# Patient Record
Sex: Male | Born: 1990 | Race: White | Hispanic: No | Marital: Single | Smoking: Current some day smoker
Health system: Southern US, Community
[De-identification: ages and names within clinical notes are randomized; demographics above are authoritative.]

## PROBLEM LIST (undated history)

## (undated) DIAGNOSIS — I10 Essential (primary) hypertension: Secondary | ICD-10-CM

---

## 2020-05-27 ENCOUNTER — Emergency Department (HOSPITAL_BASED_OUTPATIENT_CLINIC_OR_DEPARTMENT_OTHER): Payer: Managed Care, Other (non HMO)

## 2020-05-27 ENCOUNTER — Encounter (HOSPITAL_BASED_OUTPATIENT_CLINIC_OR_DEPARTMENT_OTHER): Payer: Self-pay

## 2020-05-27 ENCOUNTER — Other Ambulatory Visit: Payer: Self-pay

## 2020-05-27 ENCOUNTER — Observation Stay (HOSPITAL_BASED_OUTPATIENT_CLINIC_OR_DEPARTMENT_OTHER)
Admission: EM | Admit: 2020-05-27 | Discharge: 2020-05-30 | Disposition: A | Discharge status: Home / Self Care | Payer: Managed Care, Other (non HMO) | Attending: Internal Medicine | Admitting: Internal Medicine

## 2020-05-27 DIAGNOSIS — K7200 Acute and subacute hepatic failure without coma: Principal | ICD-10-CM | POA: Diagnosis present

## 2020-05-27 DIAGNOSIS — R1084 Generalized abdominal pain: Secondary | ICD-10-CM | POA: Insufficient documentation

## 2020-05-27 DIAGNOSIS — R17 Unspecified jaundice: Secondary | ICD-10-CM

## 2020-05-27 DIAGNOSIS — Z20822 Contact with and (suspected) exposure to covid-19: Secondary | ICD-10-CM | POA: Insufficient documentation

## 2020-05-27 DIAGNOSIS — R7989 Other specified abnormal findings of blood chemistry: Secondary | ICD-10-CM

## 2020-05-27 DIAGNOSIS — R7401 Elevation of levels of liver transaminase levels: Secondary | ICD-10-CM

## 2020-05-27 DIAGNOSIS — R5383 Other fatigue: Secondary | ICD-10-CM | POA: Diagnosis present

## 2020-05-27 DIAGNOSIS — R933 Abnormal findings on diagnostic imaging of other parts of digestive tract: Secondary | ICD-10-CM

## 2020-05-27 DIAGNOSIS — F17290 Nicotine dependence, other tobacco product, uncomplicated: Secondary | ICD-10-CM | POA: Diagnosis not present

## 2020-05-27 DIAGNOSIS — Y900 Blood alcohol level of less than 20 mg/100 ml: Secondary | ICD-10-CM | POA: Diagnosis not present

## 2020-05-27 DIAGNOSIS — I10 Essential (primary) hypertension: Secondary | ICD-10-CM | POA: Diagnosis not present

## 2020-05-27 HISTORY — DX: Essential (primary) hypertension: I10

## 2020-05-27 LAB — URINALYSIS, ROUTINE W REFLEX MICROSCOPIC
Glucose, UA: NEGATIVE mg/dL
Hgb urine dipstick: NEGATIVE
Ketones, ur: NEGATIVE mg/dL
Leukocytes,Ua: NEGATIVE
Nitrite: NEGATIVE
Protein, ur: NEGATIVE mg/dL
Specific Gravity, Urine: 1.02 (ref 1.005–1.030)
pH: 5.5 (ref 5.0–8.0)

## 2020-05-27 LAB — ACETAMINOPHEN LEVEL: Acetaminophen (Tylenol), Serum: <10 ug/mL — ABNORMAL LOW (ref 10–30)

## 2020-05-27 LAB — RAPID URINE DRUG SCREEN, HOSP PERFORMED
Amphetamines: NOT DETECTED
Barbiturates: NOT DETECTED
Benzodiazepines: NOT DETECTED
Cocaine: NOT DETECTED
Opiates: NOT DETECTED
Tetrahydrocannabinol: NOT DETECTED

## 2020-05-27 LAB — HEPATIC FUNCTION PANEL
ALT: 5698 U/L — ABNORMAL HIGH (ref 0–44)
AST: 1929 U/L — ABNORMAL HIGH (ref 15–41)
Albumin: 4.4 g/dL (ref 3.5–5.0)
Alkaline Phosphatase: 171 U/L — ABNORMAL HIGH (ref 38–126)
Bilirubin, Direct: 5.8 mg/dL — ABNORMAL HIGH (ref 0.0–0.2)
Indirect Bilirubin: 4.5 mg/dL — ABNORMAL HIGH (ref 0.3–0.9)
Total Bilirubin: 10.3 mg/dL — ABNORMAL HIGH (ref 0.3–1.2)
Total Protein: 7 g/dL (ref 6.5–8.1)

## 2020-05-27 LAB — CBC
HCT: 55.7 % — ABNORMAL HIGH (ref 39.0–52.0)
Hemoglobin: 18.6 g/dL — ABNORMAL HIGH (ref 13.0–17.0)
MCH: 31.9 pg (ref 26.0–34.0)
MCHC: 33.4 g/dL (ref 30.0–36.0)
MCV: 95.5 fL (ref 80.0–100.0)
Platelets: 265 10*3/uL (ref 150–400)
RBC: 5.83 MIL/uL — ABNORMAL HIGH (ref 4.22–5.81)
RDW: 12.8 % (ref 11.5–15.5)
WBC: 7.5 10*3/uL (ref 4.0–10.5)
nRBC: 0 % (ref 0.0–0.2)

## 2020-05-27 LAB — RESP PANEL BY RT-PCR (FLU A&B, COVID) ARPGX2
Influenza A by PCR: NEGATIVE
Influenza B by PCR: NEGATIVE
SARS Coronavirus 2 by RT PCR: NEGATIVE

## 2020-05-27 LAB — TROPONIN I (HIGH SENSITIVITY)
Troponin I (High Sensitivity): 12 ng/L (ref <18)
Troponin I (High Sensitivity): 16 ng/L (ref <18)

## 2020-05-27 LAB — ETHANOL: Alcohol, Ethyl (B): <10 mg/dL (ref <10)

## 2020-05-27 LAB — BASIC METABOLIC PANEL
Anion gap: 15 (ref 5–15)
BUN: 14 mg/dL (ref 6–20)
CO2: 21 mmol/L — ABNORMAL LOW (ref 22–32)
Calcium: 9.9 mg/dL (ref 8.9–10.3)
Chloride: 99 mmol/L (ref 98–111)
Creatinine, Ser: 0.69 mg/dL (ref 0.61–1.24)
GFR, Estimated: >60 mL/min (ref >60)
Glucose, Bld: 86 mg/dL (ref 70–99)
Potassium: 3.9 mmol/L (ref 3.5–5.1)
Sodium: 135 mmol/L (ref 135–145)

## 2020-05-27 LAB — APTT: aPTT: 33 seconds (ref 24–36)

## 2020-05-27 LAB — LIPASE, BLOOD: Lipase: 62 U/L — ABNORMAL HIGH (ref 11–51)

## 2020-05-27 LAB — PROTIME-INR
INR: 2.6 — ABNORMAL HIGH (ref 0.8–1.2)
Prothrombin Time: 27.5 seconds — ABNORMAL HIGH (ref 11.4–15.2)

## 2020-05-27 MED ORDER — SODIUM CHLORIDE 0.9 % IV SOLN: 250.0000 mL | INTRAVENOUS | Status: DC | PRN

## 2020-05-27 MED ORDER — SODIUM CHLORIDE 0.9% FLUSH: 3.0000 mL | INTRAVENOUS | Status: DC | PRN

## 2020-05-27 MED ORDER — SODIUM CHLORIDE 0.9% FLUSH
3.0000 mL | Freq: Two times a day (BID) | INTRAVENOUS | Status: DC
  Administered 2020-05-27 – 2020-05-29 (×4): 3 mL via INTRAVENOUS

## 2020-05-27 MED ORDER — SODIUM CHLORIDE 0.9 % IV BOLUS
1000.0000 mL | Freq: Once | INTRAVENOUS | Status: AC
  Administered 2020-05-27: 1000 mL via INTRAVENOUS

## 2020-05-27 MED ORDER — POLYETHYLENE GLYCOL 3350 17 G PO PACK: 17.0000 g | PACK | Freq: Every day | ORAL | Status: DC | PRN

## 2020-05-27 MED ORDER — HYDRALAZINE HCL 25 MG PO TABS: 25.0000 mg | ORAL_TABLET | Freq: Four times a day (QID) | ORAL | Status: DC | PRN

## 2020-05-27 MED ORDER — ONDANSETRON HCL 4 MG PO TABS: 4.0000 mg | ORAL_TABLET | Freq: Four times a day (QID) | ORAL | Status: DC | PRN

## 2020-05-27 MED ORDER — ONDANSETRON HCL 4 MG/2ML IJ SOLN
4.0000 mg | Freq: Once | INTRAMUSCULAR | Status: AC
  Administered 2020-05-27: 4 mg via INTRAVENOUS
  Filled 2020-05-27: qty 2

## 2020-05-27 MED ORDER — ONDANSETRON HCL 4 MG/2ML IJ SOLN
4.0000 mg | Freq: Four times a day (QID) | INTRAMUSCULAR | Status: DC | PRN
Start: 2020-05-27 — End: 2020-05-30

## 2020-05-27 MED ORDER — TRAMADOL HCL 50 MG PO TABS
50.0000 mg | ORAL_TABLET | Freq: Three times a day (TID) | ORAL | Status: DC | PRN
  Filled 2020-05-27: qty 1

## 2020-05-27 NOTE — Consult Note (Signed)
Referring Provider: Dr. Caleb Popp Primary Care Physician:  Pcp, No Primary Gastroenterologist:  Gentry Fitz  Reason for Consultation:  Jaundice  HPI: Needham Biggins is a 30 y.o. male with acute onset of nausea and vomiting, body aches, and severe fatigue for the past 4 days. Started having diffuse abdominal pain yesterday that was worse down his LEFT side. Noticed the yellow in his eyes on the way to the ER today. Denies change in BMs. Denies diarrhea or rectal bleeding. Denies previous history of jaundice or hepatitis. Drinks 3 mixed drinks per day about 4 times per week and last had a drink last Wed. Took Tylenol prn Thursday but denies going above the recommended dose. Has eaten out multiple times over the last week stating he is in town for work. Reports being in a monogamous relationship for the past 5 years. U/S negative for gallstones or biliary dilation. LFTs: TB 10.3, DB 5.8, AST 1,929, ALT 5,698, ALP 171, Lipase 62, INR 2.6, Plts 265.  Past Medical History:  Diagnosis Date  . Hypertension     History reviewed. No pertinent surgical history.  Prior to Admission medications   Not on File    Scheduled Meds: . sodium chloride flush  3 mL Intravenous Q12H   Continuous Infusions: . sodium chloride     PRN Meds:.sodium chloride, hydrALAZINE, ondansetron **OR** ondansetron (ZOFRAN) IV, polyethylene glycol, sodium chloride flush, traMADol  Allergies as of 05/27/2020  . (No Known Allergies)    Family History  Problem Relation Age of Onset  . Hypertension Father   . Cancer Father     Social History   Socioeconomic History  . Marital status: Single    Spouse name: Not on file  . Number of children: Not on file  . Years of education: Not on file  . Highest education level: Not on file  Occupational History  . Not on file  Tobacco Use  . Smoking status: Current Some Day Smoker    Types: Cigars  . Smokeless tobacco: Never Used  . Tobacco comment: Used to smoke cigarettes   Vaping Use  . Vaping Use: Former  Substance and Sexual Activity  . Alcohol use: Yes    Alcohol/week: 10.0 standard drinks    Types: 10 Shots of liquor per week  . Drug use: Not Currently  . Sexual activity: Not on file  Other Topics Concern  . Not on file  Social History Narrative  . Not on file   Social Determinants of Health   Financial Resource Strain: Not on file  Food Insecurity: Not on file  Transportation Needs: Not on file  Physical Activity: Not on file  Stress: Not on file  Social Connections: Not on file  Intimate Partner Violence: Not on file    Review of Systems: All negative except as stated above in HPI.  Physical Exam: Vital signs: Vitals:   05/27/20 1641 05/27/20 1722  BP: (!) 155/102 (!) 146/94  Pulse: (!) 103 (!) 107  Resp: 20 19  Temp: 98.5 F (36.9 C) 97.6 F (36.4 C)  SpO2: 100% 97%   Last BM Date: 05/27/20 (per pt report) General:   Alert,  Well-developed, well-nourished, pleasant and cooperative in NAD, jaundice Head: normocephalic, atraumatic Eyes: +icteric sclera ENT: oropharynx clear Neck: supple, nontender Lungs:  Clear throughout to auscultation.   No wheezes, crackles, or rhonchi. No acute distress. Heart:  Regular rate and rhythm; no murmurs, clicks, rubs,  or gallops. Abdomen: soft, nontender, nondistended, +BS  Rectal:  Deferred Ext: no  edema  GI:  Lab Results: Recent Labs    05/27/20 1102  WBC 7.5  HGB 18.6*  HCT 55.7*  PLT 265   BMET Recent Labs    05/27/20 1102  NA 135  K 3.9  CL 99  CO2 21*  GLUCOSE 86  BUN 14  CREATININE 0.69  CALCIUM 9.9   LFT Recent Labs    05/27/20 1102  PROT 7.0  ALBUMIN 4.4  AST 1,929*  ALT 5,698*  ALKPHOS 171*  BILITOT 10.3*  BILIDIR 5.8*  IBILI 4.5*   PT/INR Recent Labs    05/27/20 1247  LABPROT 27.5*  INR 2.6*     Studies/Results: DG Chest 2 View  Result Date: 05/27/2020 CLINICAL DATA:  Chest tightness EXAM: CHEST - 2 VIEW COMPARISON:  None. FINDINGS: The  heart size and mediastinal contours are within normal limits. Both lungs are clear. The visualized skeletal structures are unremarkable. IMPRESSION: No active cardiopulmonary disease. Electronically Signed   By: Gerome Sam III M.D   On: 05/27/2020 11:59   US Abdomen Limited RUQ (LIVER/GB)  Result Date: 05/27/2020 CLINICAL DATA:  Generalized abdominal pain, nausea and vomiting for 4 days. EXAM: ULTRASOUND ABDOMEN LIMITED RIGHT UPPER QUADRANT COMPARISON:  None. FINDINGS: Gallbladder: Gallbladder is contracted. Gallbladder wall thickness measures 5 mm, likely accentuated by the contraction. No discrete gallstones. No pericholecystic fluid. No sonographic Murphy sign elicited during the exam. Common bile duct: Diameter: 2 mm Liver: Liver is echogenic indicating fatty infiltration. No focal mass or lesion is seen within the liver. Portal vein is patent on color Doppler imaging with normal direction of blood flow towards the liver. Other: None. IMPRESSION: 1. Gallbladder is contracted, limiting characterization. No secondary signs of acute cholecystitis. No gallstones identified. 2. No bile duct dilatation appreciated. 3. Fatty infiltration of the liver. 4. Given the history of jaundice, consider more definitive characterization with MRCP. Electronically Signed   By: Bary Richard M.D.   On: 05/27/2020 14:31    Impression/Plan: Jaundice likely due to acute viral hepatitis (acute hep B most likely then Hep A). Viral hepatitis panel pending. EBV also possible source. Follow INR closely. I do not think additional imaging is needed at this time unless viral hepatitis panel is negative and then would do CT as next step. Supportive care. Low fat diet ok. [facility] will f/u tomorrow.    LOS: 0 days   Shirley Friar  05/27/2020, 6:15 PM  Questions please call [phone]

## 2020-05-27 NOTE — ED Provider Notes (Signed)
[facility] EMERGENCY DEPARTMENT Provider Note   CSN: [id number] Arrival date & time: 05/27/20  1018     History Chief Complaint  Patient presents with  . Emesis  . Nausea    James Mcgee is a 30 y.o. male with past medical history of HTN who presents the ED with 3-day history of cold symptoms  On my examination, patient reports that on 05/25/2020 he woke up feeling ill.  He try to go to work, but endorsed generalized fatigue and generalized body aches.  He ultimately left early.  He went home and that his body aches continued.  Yesterday he developed nausea and had episodes of vomiting whenever he tried to eat or drink.  He states that he has had diminished appetite recently.  He denies any obvious fevers or chills.  He lives with his dad, states that he has been unaffected.    Patient did have moderate amount of abdominal cramping yesterday, but no ongoing abdominal pains today.  He noticed that his eyes looked a little bit yellow in the mirror this morning prompting him to come to the ED for evaluation.  He has not had a good bowel movement since Thursday, but denies any history of abdominal surgeries and instead attributes it to his diminished appetite and diminished ability to tolerate p.o. intake.  He also states that his urine looked dark.  He denies any nasal congestion, cough, chest pain, back pain, continued abdominal pain, bloody stools, hematemesis, or other symptoms.  He states that he has been treating his symptoms with Tylenol, but as directed.  He reads the back of the label to make sure he is not taking more than the recommended amount.  He had also been taking NyQuil, but made sure to take that into consideration when combined with Tylenol.    He does endorse a history of alcohol use, but only 3 drinks per night roughly 3 days/week.  He denies any recent international travel.  No concerning sexual history, in a monogamous relationship.  No recent  tattoos.  HPI     Past Medical History:  Diagnosis Date  . Hypertension     Patient Active Problem List   Diagnosis Date Noted  . Acute liver failure 05/27/2020    History reviewed. No pertinent surgical history.     Family History  Problem Relation Age of Onset  . Hypertension Father   . Cancer Father     Social History   Tobacco Use  . Smoking status: Current Every Day Smoker    Types: Cigars  . Smokeless tobacco: Never Used  . Tobacco comment: once a month  Vaping Use  . Vaping Use: Former  Substance Use Topics  . Alcohol use: Yes    Alcohol/week: 10.0 standard drinks    Types: 10 Shots of liquor per week  . Drug use: Not Currently    Home Medications Prior to Admission medications   Not on File    Allergies    Patient has no known allergies.  Review of Systems   Review of Systems  All other systems reviewed and are negative.   Physical Exam Updated Vital Signs BP (!) 135/94   Pulse (!) 101   Temp 98.3 F (36.8 C) (Oral)   Resp 18   Ht 6' (1.829 m)   Wt 95.3 kg   SpO2 99%   BMI 28.48 kg/m   Physical Exam Vitals and nursing note reviewed. Exam conducted with a chaperone present.  Constitutional:  General: He is not in acute distress.    Appearance: He is not toxic-appearing.  HENT:     Head: Normocephalic and atraumatic.  Eyes:     General: Scleral icterus present.     Conjunctiva/sclera: Conjunctivae normal.  Cardiovascular:     Rate and Rhythm: Regular rhythm. Tachycardia present.     Pulses: Normal pulses.  Pulmonary:     Effort: Pulmonary effort is normal. No respiratory distress.  Abdominal:     General: Abdomen is flat. There is no distension.     Palpations: Abdomen is soft.     Tenderness: There is no abdominal tenderness. There is no guarding.     Comments: Soft, nondistended.  No prior history of abdominal surgery.  No areas of tenderness.  Specifically, reassess right upper quadrant on multiple occasions, no  tenderness.  No guarding.  No overlying skin changes.  Musculoskeletal:        General: Normal range of motion.  Skin:    General: Skin is dry.  Neurological:     Mental Status: He is alert and oriented to person, place, and time.     GCS: GCS eye subscore is 4. GCS verbal subscore is 5. GCS motor subscore is 6.  Psychiatric:        Mood and Affect: Mood normal.        Behavior: Behavior normal.        Thought Content: Thought content normal.     ED Results / Procedures / Treatments   Labs (all labs ordered are listed, but only abnormal results are displayed) Labs Reviewed  BASIC METABOLIC PANEL - Abnormal; Notable for the following components:      Result Value   CO2 21 (*)    All other components within normal limits  CBC - Abnormal; Notable for the following components:   RBC 5.83 (*)    Hemoglobin 18.6 (*)    HCT 55.7 (*)    All other components within normal limits  HEPATIC FUNCTION PANEL - Abnormal; Notable for the following components:   AST 1,929 (*)    ALT 5,698 (*)    Alkaline Phosphatase 171 (*)    Total Bilirubin 10.3 (*)    Bilirubin, Direct 5.8 (*)    Indirect Bilirubin 4.5 (*)    All other components within normal limits  LIPASE, BLOOD - Abnormal; Notable for the following components:   Lipase 62 (*)    All other components within normal limits  ACETAMINOPHEN LEVEL - Abnormal; Notable for the following components:   Acetaminophen (Tylenol), Serum <10 (*)    All other components within normal limits  PROTIME-INR - Abnormal; Notable for the following components:   Prothrombin Time 27.5 (*)    INR 2.6 (*)    All other components within normal limits  RESP PANEL BY RT-PCR (FLU A&B, COVID) ARPGX2  APTT  URINALYSIS, ROUTINE W REFLEX MICROSCOPIC  HEPATITIS PANEL, ACUTE  TROPONIN I (HIGH SENSITIVITY)  TROPONIN I (HIGH SENSITIVITY)    EKG EKG Interpretation  Date/Time:  Sunday May 27 2020 10:50:07 EDT Ventricular Rate:  136 PR Interval:  138 QRS  Duration: 74 QT Interval:  284 QTC Calculation: 427 R Axis:   125 Text Interpretation: Sinus tachycardia Right axis deviation Abnormal ECG No old tracing to compare Confirmed by Rolan Bucco [phone]) on 05/27/2020 2:12:20 PM   Radiology DG Chest 2 View  Result Date: 05/27/2020 CLINICAL DATA:  Chest tightness EXAM: CHEST - 2 VIEW COMPARISON:  None. FINDINGS: The heart  size and mediastinal contours are within normal limits. Both lungs are clear. The visualized skeletal structures are unremarkable. IMPRESSION: No active cardiopulmonary disease. Electronically Signed   By: Gerome Sam III M.D   On: 05/27/2020 11:59   US Abdomen Limited RUQ (LIVER/GB)  Result Date: 05/27/2020 CLINICAL DATA:  Generalized abdominal pain, nausea and vomiting for 4 days. EXAM: ULTRASOUND ABDOMEN LIMITED RIGHT UPPER QUADRANT COMPARISON:  None. FINDINGS: Gallbladder: Gallbladder is contracted. Gallbladder wall thickness measures 5 mm, likely accentuated by the contraction. No discrete gallstones. No pericholecystic fluid. No sonographic Murphy sign elicited during the exam. Common bile duct: Diameter: 2 mm Liver: Liver is echogenic indicating fatty infiltration. No focal mass or lesion is seen within the liver. Portal vein is patent on color Doppler imaging with normal direction of blood flow towards the liver. Other: None. IMPRESSION: 1. Gallbladder is contracted, limiting characterization. No secondary signs of acute cholecystitis. No gallstones identified. 2. No bile duct dilatation appreciated. 3. Fatty infiltration of the liver. 4. Given the history of jaundice, consider more definitive characterization with MRCP. Electronically Signed   By: Bary Richard M.D.   On: 05/27/2020 14:31    Procedures .Critical Care Performed by: Lorelee New, PA-C Authorized by: Lorelee New, PA-C   Critical care provider statement:    Critical care time (minutes):  45   Critical care was necessary to treat or prevent imminent  or life-threatening deterioration of the following conditions:  Hepatic failure   Critical care was time spent personally by me on the following activities:  Discussions with consultants, evaluation of patient's response to treatment, examination of patient, ordering and performing treatments and interventions, ordering and review of laboratory studies, ordering and review of radiographic studies, pulse oximetry, re-evaluation of patient's condition, obtaining history from patient or surrogate and review of old charts Comments:     Severe transaminitis, jaundice.     Medications Ordered in ED Medications  sodium chloride 0.9 % bolus 1,000 mL ( Intravenous Stopped 05/27/20 1322)  ondansetron (ZOFRAN) injection 4 mg (4 mg Intravenous Given 05/27/20 1216)    ED Course  I have reviewed the triage vital signs and the nursing notes.  Pertinent labs & imaging results that were available during my care of the patient were reviewed by me and considered in my medical decision making (see chart for details).  Clinical Course as of 05/27/20 1552  Sun May 27, 2020  1505 Spoke with Dr. Ronaldo Miyamoto, hospitalist.  He asks that we first speak with gastroenterology.   [GG]  1510 I spoke with Dr. Bosie Clos, GI, who agrees with current work-up.  He states that patient can be admitted to hospitalist services for continued supportive care.  GI will add him to their list and see patient either today or tomorrow. [GG]    Clinical Course User Index [GG] Lorelee New, PA-C   MDM Rules/Calculators/A&P                          James Mcgee was evaluated in Emergency Department on 05/27/2020 for the symptoms described in the history of present illness. He was evaluated in the context of the global COVID-19 pandemic, which necessitated consideration that the patient might be at risk for infection with the SARS-CoV-2 virus that causes COVID-19. Institutional protocols and algorithms that pertain to the evaluation of patients  at risk for COVID-19 are in a state of rapid change based on information released by regulatory bodies including the  CDC and federal and [employer]. These policies and algorithms were followed during the patient's care in the ED.  I personally reviewed patient's medical chart and all notes from triage and staff during today's encounter. I have also ordered and reviewed all labs and imaging that I felt to be medically necessary in the evaluation of this patient's complaints and with consideration of their physical exam. If needed, translation services were available and utilized.   Patient with a 3-day history of generalized weakness, diminished appetite, fatigue, nausea, and vomiting.  Suspect viral etiology.  He does have scleral icterus and mild jaundice, most notably over torso.  We will obtain hepatic function panel in addition to basic laboratory work-up.  Troponin and chest x-ray obtained out in triage due to his reported chest tightness, but denies any chest pain or difficulty breathing on my examination.    Hepatic function panel test notable for total bilirubin of 10.3, 5.8 direct bilirubin.  This suggests intrahepatic process.  AST elevated 1929.  ALT elevated at 5698.  Alkaline phosphatase minimally elevated at 171.  We will proceed with acetaminophen level, hepatitis panel, PT/INR, APTT, and lipase.  Abdominal ultrasound is without any obvious evidence of acute cholecystitis.  No gallstones.  No bile duct dilation.  Fatty infiltration of liver noted.  Recommending MRCP for better clarity.   Spoke with Dr. Ronaldo Miyamoto, hospitalist.  He asks that we first speak with gastroenterology.    I spoke with Dr. Bosie Clos, GI, who agrees with current work-up.  He states that patient can be admitted to hospitalist services for continued supportive care.  GI will add him to their list and see patient either today or tomorrow.   Final Clinical Impression(s) / ED Diagnoses Final diagnoses:  Jaundice   Transaminitis    Rx / DC Orders ED Discharge Orders    None       Lorelee New, PA-C 05/27/20 1552    Rolan Bucco, MD 05/30/20 2247

## 2020-05-27 NOTE — H&P (Signed)
History and Physical    James Mcgee HGD:[id number] DOB: 1990-03-24 DOA: 05/27/2020  PCP: Pcp, No Patient coming from: Home  Chief Complaint: Nausea/vomiting  HPI: James Mcgee is a 30 y.o. male with medical history significant of hypertension.  Patient presented secondary to 4 days of persistent nausea and vomiting.  Patient reports he was taking Tylenol and NyQuil scheduled for the past 4 days secondary to his symptoms.  He reports some body aches that he suffered 2 days ago which lasted for few hours.  He has not been able to keep any fluids down to keep self hydrated.  On his way to the emergency department, he reports noticing his eyes were yellow.  With regard to his Tylenol use, patient states that in the past he is taking scheduled Tylenol/NyQuil without any adverse effects.  Patient reports drinking about 3 mixed drinks (liquor) 4 times per week.  ED Course: Vitals: Temperature 97.6 F, pulse of 107, respirations of 19, blood pressure 146/94, SPO2 of 97% on room air Labs: CO2 of 21, ALP 171, AST/ALT of 199/5698, lipase of 62, total bilirubin of 10.3, hemoglobin of 18.6, INR of 2.6, undetectable Tylenol level Imaging: Fatty liver Medications/Course: 1 L normal saline IV fluid bolus  Review of Systems: Review of Systems  Constitutional: Negative for chills and fever.  Respiratory: Positive for shortness of breath.   Cardiovascular: Negative for chest pain.  Gastrointestinal: Positive for nausea and vomiting (no hematemesis). Negative for blood in stool, constipation and diarrhea.  Musculoskeletal: Positive for myalgias.  All other systems reviewed and are negative.   Past Medical History:  Diagnosis Date  . Hypertension     History reviewed. No pertinent surgical history.   reports that he has been smoking cigars. He has never used smokeless tobacco. He reports current alcohol use of about 10.0 standard drinks of alcohol per week. He reports previous drug use.  No Known  Allergies  Family History  Problem Relation Age of Onset  . Hypertension Father   . Cancer Father     Prior to Admission medications   Not on File    Physical Exam:  Physical Exam Constitutional:      General: He is not in acute distress.    Appearance: He is not diaphoretic.  Eyes:     General: Scleral icterus present.     Conjunctiva/sclera: Conjunctivae normal.     Pupils: Pupils are equal, round, and reactive to light.  Cardiovascular:     Rate and Rhythm: Normal rate and regular rhythm.     Heart sounds: Normal heart sounds. No murmur heard.   Pulmonary:     Effort: Pulmonary effort is normal. No respiratory distress.     Breath sounds: Normal breath sounds. No wheezing or rales.  Abdominal:     General: Bowel sounds are normal. There is no distension.     Palpations: Abdomen is soft.     Tenderness: There is no abdominal tenderness. There is no guarding or rebound.  Musculoskeletal:        General: No tenderness. Normal range of motion.     Cervical back: Normal range of motion.  Lymphadenopathy:     Cervical: No cervical adenopathy.  Skin:    General: Skin is warm and dry.  Neurological:     Mental Status: He is alert and oriented to person, place, and time.      Labs on Admission: I have personally reviewed following labs and imaging studies  CBC: Recent Labs  Lab  05/27/20 1102  WBC 7.5  HGB 18.6*  HCT 55.7*  MCV 95.5  PLT 265    Basic Metabolic Panel: Recent Labs  Lab 05/27/20 1102  NA 135  K 3.9  CL 99  CO2 21*  GLUCOSE 86  BUN 14  CREATININE 0.69  CALCIUM 9.9    GFR: Estimated Creatinine Clearance: 164.7 mL/min (by C-G formula based on SCr of 0.69 mg/dL).  Liver Function Tests: Recent Labs  Lab 05/27/20 1102  AST 1,929*  ALT 5,698*  ALKPHOS 171*  BILITOT 10.3*  PROT 7.0  ALBUMIN 4.4   Recent Labs  Lab 05/27/20 1247  LIPASE 62*   Coagulation Profile: Recent Labs  Lab 05/27/20 1247  INR 2.6*    Urine  analysis:    Component Value Date/Time   COLORURINE AMBER (A) 05/27/2020 1530   APPEARANCEUR CLEAR 05/27/2020 1530   LABSPEC 1.020 05/27/2020 1530   PHURINE 5.5 05/27/2020 1530   GLUCOSEU NEGATIVE 05/27/2020 1530   HGBUR NEGATIVE 05/27/2020 1530   BILIRUBINUR LARGE (A) 05/27/2020 1530   KETONESUR NEGATIVE 05/27/2020 1530   PROTEINUR NEGATIVE 05/27/2020 1530   NITRITE NEGATIVE 05/27/2020 1530   LEUKOCYTESUR NEGATIVE 05/27/2020 1530     Radiological Exams on Admission: DG Chest 2 View  Result Date: 05/27/2020 CLINICAL DATA:  Chest tightness EXAM: CHEST - 2 VIEW COMPARISON:  None. FINDINGS: The heart size and mediastinal contours are within normal limits. Both lungs are clear. The visualized skeletal structures are unremarkable. IMPRESSION: No active cardiopulmonary disease. Electronically Signed   By: Gerome Sam III M.D   On: 05/27/2020 11:59   US Abdomen Limited RUQ (LIVER/GB)  Result Date: 05/27/2020 CLINICAL DATA:  Generalized abdominal pain, nausea and vomiting for 4 days. EXAM: ULTRASOUND ABDOMEN LIMITED RIGHT UPPER QUADRANT COMPARISON:  None. FINDINGS: Gallbladder: Gallbladder is contracted. Gallbladder wall thickness measures 5 mm, likely accentuated by the contraction. No discrete gallstones. No pericholecystic fluid. No sonographic Murphy sign elicited during the exam. Common bile duct: Diameter: 2 mm Liver: Liver is echogenic indicating fatty infiltration. No focal mass or lesion is seen within the liver. Portal vein is patent on color Doppler imaging with normal direction of blood flow towards the liver. Other: None. IMPRESSION: 1. Gallbladder is contracted, limiting characterization. No secondary signs of acute cholecystitis. No gallstones identified. 2. No bile duct dilatation appreciated. 3. Fatty infiltration of the liver. 4. Given the history of jaundice, consider more definitive characterization with MRCP. Electronically Signed   By: Bary Richard M.D.   On: 05/27/2020 14:31     EKG: Independently reviewed. Sinus tachycardia  Assessment/Plan Active Problems:   Acute liver failure   Acute hepatic failure Unknown etiology at this time. Elevated AST/ALT/bilirubin of 1929/5698/103 respectively. Possibly viral hepatitis, drug induced. No hypotension noted. Obstruction is a possibility but unsure AST/ALT would be this significant in extrahepatic etiology. Ultrasound significant for fatty liver disease. GI consulted on admission -GI recommendations -Will likely need more advanced imaging but will defer to GI -Daily CMP, INR -Heart healthy diet  Hypertension Patient is currently not on medication management.  Currently uncontrolled -Hydralazine as needed  Fatty liver disease Patient states that this is hereditary.  Patient has a history of alcohol use.  Unsure if etiology.  Patient is overweight but not obese.  Discussed decreasing alcohol consumption.   DVT prophylaxis: SCDs Code Status: Full code Family Communication: None at bedside Disposition Plan: Discharge home likely in several days pending improvement of liver failure Consults called: [facility] Admission status: Inpatient  Jacquelin Hawking, MD [facility] 05/27/2020, 5:21 PM

## 2020-05-27 NOTE — ED Notes (Signed)
Permission to remove patient from monitor to go to [facility] by [facility].

## 2020-05-27 NOTE — ED Triage Notes (Addendum)
N/V x3 days last emesis last night Slightly icteric sclera HR 144  BP 148/98  Chest tightness  NAD

## 2020-05-27 NOTE — H&P (Incomplete)
History and Physical    Valor Quaintance WLN:[id number] DOB: 1990-10-30 DOA: 05/27/2020  PCP: Pcp, No Patient coming from: Home  Chief Complaint: ***  HPI: James Mcgee is a 30 y.o. male with medical history significant of ***. ***. Two days ago had body aches which lasted a few hours. Patient reports constant nausea with vomiting. Vomitus with without blood. He was taking fluids. He was taking Tylenol 500 mg 30 ml/ 6 hours. He was also taking Nyquil q4 hours for the last 4 days. Finally came for evaluation secondary to continued to . 3 mixed drinks 4 times a week. He has been in [place] since Monday coming from [place].  ED Course: Vitals: *** Labs: *** Imaging: *** Medications/Course: ***  Review of Systems: ROS  Past Medical History:  Diagnosis Date  . Hypertension     History reviewed. No pertinent surgical history.   reports that he has been smoking cigars. He has never used smokeless tobacco. He reports current alcohol use of about 10.0 standard drinks of alcohol per week. He reports previous drug use.  No Known Allergies  Family History  Problem Relation Age of Onset  . Hypertension Father   . Cancer Father    Prior to Admission medications   Not on File    Physical Exam:  Physical Exam***   Labs on Admission: I have personally reviewed following labs and imaging studies  CBC: Recent Labs  Lab 05/27/20 1102  WBC 7.5  HGB 18.6*  HCT 55.7*  MCV 95.5  PLT 265    Basic Metabolic Panel: Recent Labs  Lab 05/27/20 1102  NA 135  K 3.9  CL 99  CO2 21*  GLUCOSE 86  BUN 14  CREATININE 0.69  CALCIUM 9.9    GFR: Estimated Creatinine Clearance: 164.7 mL/min (by C-G formula based on SCr of 0.69 mg/dL).  Liver Function Tests: Recent Labs  Lab 05/27/20 1102  AST 1,929*  ALT 5,698*  ALKPHOS 171*  BILITOT 10.3*  PROT 7.0  ALBUMIN 4.4   Recent Labs  Lab 05/27/20 1247  LIPASE 62*   No results for input(s): AMMONIA in the last 168  hours.  Coagulation Profile: Recent Labs  Lab 05/27/20 1247  INR 2.6*    Cardiac Enzymes: No results for input(s): CKTOTAL, CKMB, CKMBINDEX, TROPONINI in the last 168 hours.  BNP (last 3 results) No results for input(s): PROBNP in the last 8760 hours.  HbA1C: No results for input(s): HGBA1C in the last 72 hours.  CBG: No results for input(s): GLUCAP in the last 168 hours.  Lipid Profile: No results for input(s): CHOL, HDL, LDLCALC, TRIG, CHOLHDL, LDLDIRECT in the last 72 hours.  Thyroid Function Tests: No results for input(s): TSH, T4TOTAL, FREET4, T3FREE, THYROIDAB in the last 72 hours.  Anemia Panel: No results for input(s): VITAMINB12, FOLATE, FERRITIN, TIBC, IRON, RETICCTPCT in the last 72 hours.  Urine analysis:    Component Value Date/Time   COLORURINE AMBER (A) 05/27/2020 1530   APPEARANCEUR CLEAR 05/27/2020 1530   LABSPEC 1.020 05/27/2020 1530   PHURINE 5.5 05/27/2020 1530   GLUCOSEU NEGATIVE 05/27/2020 1530   HGBUR NEGATIVE 05/27/2020 1530   BILIRUBINUR LARGE (A) 05/27/2020 1530   KETONESUR NEGATIVE 05/27/2020 1530   PROTEINUR NEGATIVE 05/27/2020 1530   NITRITE NEGATIVE 05/27/2020 1530   LEUKOCYTESUR NEGATIVE 05/27/2020 1530     Radiological Exams on Admission: DG Chest 2 View  Result Date: 05/27/2020 CLINICAL DATA:  Chest tightness EXAM: CHEST - 2 VIEW COMPARISON:  None. FINDINGS:  The heart size and mediastinal contours are within normal limits. Both lungs are clear. The visualized skeletal structures are unremarkable. IMPRESSION: No active cardiopulmonary disease. Electronically Signed   By: Gerome Sam III M.D   On: 05/27/2020 11:59   US Abdomen Limited RUQ (LIVER/GB)  Result Date: 05/27/2020 CLINICAL DATA:  Generalized abdominal pain, nausea and vomiting for 4 days. EXAM: ULTRASOUND ABDOMEN LIMITED RIGHT UPPER QUADRANT COMPARISON:  None. FINDINGS: Gallbladder: Gallbladder is contracted. Gallbladder wall thickness measures 5 mm, likely accentuated  by the contraction. No discrete gallstones. No pericholecystic fluid. No sonographic Murphy sign elicited during the exam. Common bile duct: Diameter: 2 mm Liver: Liver is echogenic indicating fatty infiltration. No focal mass or lesion is seen within the liver. Portal vein is patent on color Doppler imaging with normal direction of blood flow towards the liver. Other: None. IMPRESSION: 1. Gallbladder is contracted, limiting characterization. No secondary signs of acute cholecystitis. No gallstones identified. 2. No bile duct dilatation appreciated. 3. Fatty infiltration of the liver. 4. Given the history of jaundice, consider more definitive characterization with MRCP. Electronically Signed   By: Bary Richard M.D.   On: 05/27/2020 14:31    EKG: Independently reviewed. ***  Assessment/Plan Active Problems:   Acute liver failure    ***   DVT prophylaxis: *** Code Status: *** Family Communication: *** Disposition Plan: *** Consults called: *** Admission status: ***   Jacquelin Hawking, MD [facility] 05/27/2020, 5:32 PM

## 2020-05-27 NOTE — Progress Notes (Signed)
Notified by EDP of need for admission d/t acute haptic failure. [facility] accepts patient to progressive bed at [facility]. EDP is to remain responsible for orders/medical decisions while patient is holding at [facility]. Upon arrival to [facility], [facility] will assume care. Nursing staff will call flow manager/carelink to notify them of patient's arrival so that the proper [facility] member may receive the patient. Nursing staff will notify the following consultants, [facility], of patient's arrival for their evaluation. Thank you.

## 2020-05-28 ENCOUNTER — Inpatient Hospital Stay (HOSPITAL_COMMUNITY): Payer: Managed Care, Other (non HMO)

## 2020-05-28 DIAGNOSIS — I10 Essential (primary) hypertension: Secondary | ICD-10-CM | POA: Diagnosis not present

## 2020-05-28 DIAGNOSIS — K7200 Acute and subacute hepatic failure without coma: Secondary | ICD-10-CM | POA: Diagnosis not present

## 2020-05-28 LAB — CBC
HCT: 47.2 % (ref 39.0–52.0)
Hemoglobin: 15.7 g/dL (ref 13.0–17.0)
MCH: 32.6 pg (ref 26.0–34.0)
MCHC: 33.3 g/dL (ref 30.0–36.0)
MCV: 98.1 fL (ref 80.0–100.0)
Platelets: 210 10*3/uL (ref 150–400)
RBC: 4.81 MIL/uL (ref 4.22–5.81)
RDW: 13.1 % (ref 11.5–15.5)
WBC: 5.3 10*3/uL (ref 4.0–10.5)
nRBC: 0 % (ref 0.0–0.2)

## 2020-05-28 LAB — COMPREHENSIVE METABOLIC PANEL
ALT: 2163 U/L — ABNORMAL HIGH (ref 0–44)
AST: 686 U/L — ABNORMAL HIGH (ref 15–41)
Albumin: 3.5 g/dL (ref 3.5–5.0)
Alkaline Phosphatase: 125 U/L (ref 38–126)
Anion gap: 8 (ref 5–15)
BUN: 10 mg/dL (ref 6–20)
CO2: 26 mmol/L (ref 22–32)
Calcium: 8.9 mg/dL (ref 8.9–10.3)
Chloride: 106 mmol/L (ref 98–111)
Creatinine, Ser: 0.75 mg/dL (ref 0.61–1.24)
GFR, Estimated: >60 mL/min (ref >60)
Glucose, Bld: 106 mg/dL — ABNORMAL HIGH (ref 70–99)
Potassium: 3.5 mmol/L (ref 3.5–5.1)
Sodium: 140 mmol/L (ref 135–145)
Total Bilirubin: 7.2 mg/dL — ABNORMAL HIGH (ref 0.3–1.2)
Total Protein: 5.4 g/dL — ABNORMAL LOW (ref 6.5–8.1)

## 2020-05-28 LAB — HEPATITIS PANEL, ACUTE
HCV Ab: NONREACTIVE
Hep A IgM: NONREACTIVE
Hep B C IgM: NONREACTIVE
Hepatitis B Surface Ag: NONREACTIVE

## 2020-05-28 LAB — HIV ANTIBODY (ROUTINE TESTING W REFLEX): HIV Screen 4th Generation wRfx: NONREACTIVE

## 2020-05-28 LAB — PROTIME-INR
INR: 1.9 — ABNORMAL HIGH (ref 0.8–1.2)
Prothrombin Time: 22.2 seconds — ABNORMAL HIGH (ref 11.4–15.2)

## 2020-05-28 MED ORDER — GADOBUTROL 1 MMOL/ML IV SOLN
10.0000 mL | Freq: Once | INTRAVENOUS | Status: AC | PRN
  Administered 2020-05-28: 10 mL via INTRAVENOUS

## 2020-05-28 NOTE — Progress Notes (Signed)
[facility]astro[facility]ology Progress Note  James Mcgee 30 y.o. 12-05-1990  CC:  Jaundice  Subjective: Patient denies any complaints. Denies abdominal pain, nausea, vomiting.  ROS : Review of Systems  Cardiovascular: Negative for chest pain and palpitations.  Gastrointestinal: Negative for abdominal pain, blood in stool, constipation, diarrhea, heartburn, melena, nausea and vomiting.   Objective: Vital signs in last 24 hours: Vitals:   05/28/20 0204 05/28/20 0553  BP: 118/74 127/83  Pulse: 88 86  Resp: (!) 22 20  Temp: 98.5 F (36.9 C) 98.3 F (36.8 C)  SpO2: 95% 97%    Physical Exam:  General:  Alert, oriented, cooperative, no distress, jaundiced  Head:  Normocephalic, without obvious abnormality, atraumatic  Eyes:  Scleral icterus, EOMs intact  Lungs:   Clear to auscultation bilaterally, respirations unlabored  Heart:  Regular rate and rhythm, S1, S2 normal  Abdomen:   Soft, non-tender, non-distended, bowel sounds active all four quadrants  Extremities: Extremities normal, atraumatic, no  edema    Lab Results: Recent Labs    05/27/20 1102 05/28/20 0436  NA 135 140  K 3.9 3.5  CL 99 106  CO2 21* 26  GLUCOSE 86 106*  BUN 14 10  CREATININE 0.69 0.75  CALCIUM 9.9 8.9   Recent Labs    05/27/20 1102 05/28/20 0436  AST 1,929* 686*  ALT 5,698* 2,163*  ALKPHOS 171* 125  BILITOT 10.3* 7.2*  PROT 7.0 5.4*  ALBUMIN 4.4 3.5   Recent Labs    05/27/20 1102 05/28/20 0436  WBC 7.5 5.3  HGB 18.6* 15.7  HCT 55.7* 47.2  MCV 95.5 98.1  PLT 265 210   Recent Labs    05/27/20 1247 05/28/20 0436  LABPROT 27.5* 22.2*  INR 2.6* 1.9*      Assessment: Jaundice: unknown origin, though still suspicious for viral etiology.  Acute hepatitis panel negative.  -T. Bili 7.2, decreased from 10.3 yesterday -ALT 2163, improved from 5698 yesterday -AST 686, improved from 1929 yesterday -INR 1.9, decreased from 2.6 yesterday -No leukocytosis (WBCs 5.3)  Plan: EBV IgM,  HSV IgM, and CMV IgM ordered.  MRI/MRCP to rule out CBD stone (cholangitis), though this is less likely.  Clear liquids until post-ERCP, then resume soft diet.  [facility] will follow.  Edrick Kins PA-C 05/28/2020, 8:44 AM  Contact #  [phone]

## 2020-05-28 NOTE — Progress Notes (Addendum)
PROGRESS NOTE    James Mcgee  WJX:[id number] DOB: [birth year] DOA: 05/27/2020 PCP: Oneita Hurt, No   Brief Narrative: James Mcgee is a 30 y.o. male with a history of hypertension. He presented secondary to persistent nausea/vomiting and found to have evidence of acute liver failure. Workup started. GI consulted.   Assessment & Plan:   Principal Problem:   Acute liver failure Active Problems:   Primary hypertension   Acute hepatic failure Unknown etiology at this time. Elevated AST/ALT/bilirubin of 1929/5698/103 respectively. Possibly viral hepatitis, drug induced. Acute hepatitis panel is negative. AST/ALT/bilirubin trending down. No hypotension noted. Obstruction is a possibility but unsure AST/ALT would be this significant in extrahepatic etiology. Ultrasound significant for fatty liver disease. GI consulted on admission.  -GI recommendations: MRCP today -Daily CMP, INR -EBV pending  Hypertension Patient is currently not on medication management.  Currently uncontrolled -Hydralazine as needed  Fatty liver disease Patient states that this is hereditary.  Patient has a history of alcohol use.  Unsure if etiology.  Patient is overweight but not obese.  Discussed decreasing alcohol consumption.   DVT prophylaxis: SCDs Code Status:   Code Status: Full Code Family Communication: None at bedside Disposition Plan: Discharge likely in 1-2 days pending GI recommendations/management.   Consultants:   Gastroenterology [facility])  Procedures:   None  Antimicrobials:  None    Subjective: No nausea/vomiting today. No abdominal pain.  Objective: Vitals:   05/27/20 1722 05/27/20 2222 05/28/20 0204 05/28/20 0553  BP: (!) 146/94 135/76 118/74 127/83  Pulse: (!) 107 100 88 86  Resp: 19 (!) 21 (!) 22 20  Temp: 97.6 F (36.4 C) 98.4 F (36.9 C) 98.5 F (36.9 C) 98.3 F (36.8 C)  TempSrc: Oral Oral Oral Oral  SpO2: 97% 97% 95% 97%  Weight: 95.2 kg     Height: 6' (1.829  m)       Intake/Output Summary (Last 24 hours) at 05/28/2020 1112 Last data filed at 05/28/2020 1024 Gross per 24 hour  Intake 1004.29 ml  Output 250 ml  Net 754.29 ml   Filed Weights   05/27/20 1032 05/27/20 1722  Weight: 95.3 kg 95.2 kg    Examination:  General exam: Appears calm and comfortable Eyes: scleral icterus Respiratory system: Clear to auscultation. Respiratory effort normal. Cardiovascular system: S1 & S2 heard, RRR. No murmurs, rubs, gallops or clicks. Gastrointestinal system: Abdomen is nondistended, soft and nontender. No organomegaly or masses felt. Normal bowel sounds heard. Central nervous system: Alert and oriented. No focal neurological deficits. Musculoskeletal: No edema. No calf tenderness Skin: No cyanosis. No rashes Psychiatry: Judgement and insight appear normal. Mood & affect appropriate.     Data Reviewed: I have personally reviewed following labs and imaging studies  CBC Lab Results  Component Value Date   WBC 5.3 05/28/2020   RBC 4.81 05/28/2020   HGB 15.7 05/28/2020   HCT 47.2 05/28/2020   MCV 98.1 05/28/2020   MCH 32.6 05/28/2020   PLT 210 05/28/2020   MCHC 33.3 05/28/2020   RDW 13.1 05/28/2020     Last metabolic panel Lab Results  Component Value Date   NA 140 05/28/2020   K 3.5 05/28/2020   CL 106 05/28/2020   CO2 26 05/28/2020   BUN 10 05/28/2020   CREATININE 0.75 05/28/2020   GLUCOSE 106 (H) 05/28/2020   GFRNONAA >60 05/28/2020   CALCIUM 8.9 05/28/2020   PROT 5.4 (L) 05/28/2020   ALBUMIN 3.5 05/28/2020   BILITOT 7.2 (H) 05/28/2020  ALKPHOS 125 05/28/2020   AST 686 (H) 05/28/2020   ALT 2,163 (H) 05/28/2020   ANIONGAP 8 05/28/2020    CBG (last 3)  No results for input(s): GLUCAP in the last 72 hours.   GFR: Estimated Creatinine Clearance: 164.5 mL/min (by C-G formula based on SCr of 0.75 mg/dL).  Coagulation Profile: Recent Labs  Lab 05/27/20 1247 05/28/20 0436  INR 2.6* 1.9*    Recent Results (from the  past 240 hour(s))  Resp Panel by RT-PCR (Flu A&B, Covid) Nasopharyngeal Swab     Status: None   Collection Time: 05/27/20 12:19 PM   Specimen: Nasopharyngeal Swab; Nasopharyngeal(NP) swabs in vial transport medium  Result Value Ref Range Status   SARS Coronavirus 2 by RT PCR NEGATIVE NEGATIVE Final    Comment: (NOTE) SARS-CoV-2 target nucleic acids are NOT DETECTED.  The SARS-CoV-2 RNA is generally detectable in upper respiratory specimens during the acute phase of infection. The lowest concentration of SARS-CoV-2 viral copies this assay can detect is 138 copies/mL. A negative result does not preclude SARS-Cov-2 infection and should not be used as the sole basis for treatment or other patient management decisions. A negative result may occur with  improper specimen collection/handling, submission of specimen other than nasopharyngeal swab, presence of viral mutation(s) within the areas targeted by this assay, and inadequate number of viral copies(<138 copies/mL). A negative result must be combined with clinical observations, patient history, and epidemiological information. The expected result is Negative.  Fact Sheet for Patients:  BloggerCourse.com  Fact Sheet for Healthcare Providers:  SeriousBroker.it  This test is no t yet approved or cleared by the [place] FDA and  has been authorized for detection and/or diagnosis of SARS-CoV-2 by FDA under an Emergency Use Authorization (EUA). This EUA will remain  in effect (meaning this test can be used) for the duration of the COVID-19 declaration under Section 564(b)(1) of the Act, 21 U.S.C.section 360bbb-3(b)(1), unless the authorization is terminated  or revoked sooner.       Influenza A by PCR NEGATIVE NEGATIVE Final   Influenza B by PCR NEGATIVE NEGATIVE Final    Comment: (NOTE) The Xpert Xpress SARS-CoV-2/FLU/RSV plus assay is intended as an aid in the diagnosis of  influenza from Nasopharyngeal swab specimens and should not be used as a sole basis for treatment. Nasal washings and aspirates are unacceptable for Xpert Xpress SARS-CoV-2/FLU/RSV testing.  Fact Sheet for Patients: BloggerCourse.com  Fact Sheet for Healthcare Providers: SeriousBroker.it  This test is not yet approved or cleared by the [place] FDA and has been authorized for detection and/or diagnosis of SARS-CoV-2 by FDA under an Emergency Use Authorization (EUA). This EUA will remain in effect (meaning this test can be used) for the duration of the COVID-19 declaration under Section 564(b)(1) of the Act, 21 U.S.C. section 360bbb-3(b)(1), unless the authorization is terminated or revoked.  Performed at [facility], [address] [place], [place] [address]         Radiology Studies: DG Chest 2 View  Result Date: 05/27/2020 CLINICAL DATA:  Chest tightness EXAM: CHEST - 2 VIEW COMPARISON:  None. FINDINGS: The heart size and mediastinal contours are within normal limits. Both lungs are clear. The visualized skeletal structures are unremarkable. IMPRESSION: No active cardiopulmonary disease. Electronically Signed   By: Gerome Sam III M.D   On: 05/27/2020 11:59   US Abdomen Limited RUQ (LIVER/GB)  Result Date: 05/27/2020 CLINICAL DATA:  Generalized abdominal pain, nausea and vomiting for 4 days.  EXAM: ULTRASOUND ABDOMEN LIMITED RIGHT UPPER QUADRANT COMPARISON:  None. FINDINGS: Gallbladder: Gallbladder is contracted. Gallbladder wall thickness measures 5 mm, likely accentuated by the contraction. No discrete gallstones. No pericholecystic fluid. No sonographic Murphy sign elicited during the exam. Common bile duct: Diameter: 2 mm Liver: Liver is echogenic indicating fatty infiltration. No focal mass or lesion is seen within the liver. Portal vein is patent on color Doppler imaging with normal direction of blood flow  towards the liver. Other: None. IMPRESSION: 1. Gallbladder is contracted, limiting characterization. No secondary signs of acute cholecystitis. No gallstones identified. 2. No bile duct dilatation appreciated. 3. Fatty infiltration of the liver. 4. Given the history of jaundice, consider more definitive characterization with MRCP. Electronically Signed   By: Bary Richard M.D.   On: 05/27/2020 14:31        Scheduled Meds: . sodium chloride flush  3 mL Intravenous Q12H   Continuous Infusions: . sodium chloride       LOS: 1 day     Jacquelin Hawking, MD [facility] 05/28/2020, 11:12 AM  If 7PM-7AM, please contact night-coverage [email or web address]

## 2020-05-29 ENCOUNTER — Observation Stay (HOSPITAL_COMMUNITY): Payer: Managed Care, Other (non HMO)

## 2020-05-29 DIAGNOSIS — I10 Essential (primary) hypertension: Secondary | ICD-10-CM | POA: Diagnosis not present

## 2020-05-29 DIAGNOSIS — K7200 Acute and subacute hepatic failure without coma: Secondary | ICD-10-CM | POA: Diagnosis not present

## 2020-05-29 LAB — EPSTEIN-BARR VIRUS (EBV) ANTIBODY PROFILE
EBV NA IgG: 239 U/mL — ABNORMAL HIGH (ref 0.0–17.9)
EBV VCA IgG: 42.1 U/mL — ABNORMAL HIGH (ref 0.0–17.9)
EBV VCA IgM: <36 U/mL (ref 0.0–35.9)

## 2020-05-29 LAB — COMPREHENSIVE METABOLIC PANEL
ALT: 1594 U/L — ABNORMAL HIGH (ref 0–44)
AST: 293 U/L — ABNORMAL HIGH (ref 15–41)
Albumin: 3.6 g/dL (ref 3.5–5.0)
Alkaline Phosphatase: 121 U/L (ref 38–126)
Anion gap: 9 (ref 5–15)
BUN: 11 mg/dL (ref 6–20)
CO2: 27 mmol/L (ref 22–32)
Calcium: 9.3 mg/dL (ref 8.9–10.3)
Chloride: 104 mmol/L (ref 98–111)
Creatinine, Ser: 0.91 mg/dL (ref 0.61–1.24)
GFR, Estimated: >60 mL/min (ref >60)
Glucose, Bld: 101 mg/dL — ABNORMAL HIGH (ref 70–99)
Potassium: 3.9 mmol/L (ref 3.5–5.1)
Sodium: 140 mmol/L (ref 135–145)
Total Bilirubin: 7.1 mg/dL — ABNORMAL HIGH (ref 0.3–1.2)
Total Protein: 5.8 g/dL — ABNORMAL LOW (ref 6.5–8.1)

## 2020-05-29 LAB — FERRITIN: Ferritin: 1714 ng/mL — ABNORMAL HIGH (ref 24–336)

## 2020-05-29 LAB — IRON AND TIBC
Iron: 267 ug/dL — ABNORMAL HIGH (ref 45–182)
Saturation Ratios: 82 % — ABNORMAL HIGH (ref 17.9–39.5)
TIBC: 326 ug/dL (ref 250–450)
UIBC: 59 ug/dL

## 2020-05-29 LAB — CMV IGM: CMV IgM: <30 AU/mL (ref 0.0–29.9)

## 2020-05-29 LAB — PROTIME-INR
INR: 1.4 — ABNORMAL HIGH (ref 0.8–1.2)
Prothrombin Time: 16.7 seconds — ABNORMAL HIGH (ref 11.4–15.2)

## 2020-05-29 LAB — EPSTEIN-BARR VIRUS VCA, IGM: EBV VCA IgM: <36 U/mL (ref 0.0–35.9)

## 2020-05-29 MED ORDER — MIDAZOLAM HCL 2 MG/2ML IJ SOLN
INTRAMUSCULAR | Status: AC
  Filled 2020-05-29: qty 4

## 2020-05-29 MED ORDER — FENTANYL CITRATE (PF) 100 MCG/2ML IJ SOLN
INTRAMUSCULAR | Status: AC | PRN
  Administered 2020-05-29 (×2): 50 ug via INTRAVENOUS

## 2020-05-29 MED ORDER — LIDOCAINE-EPINEPHRINE (PF) 2 %-1:200000 IJ SOLN
INTRAMUSCULAR | Status: AC
  Filled 2020-05-29: qty 20

## 2020-05-29 MED ORDER — FENTANYL CITRATE (PF) 100 MCG/2ML IJ SOLN
INTRAMUSCULAR | Status: AC
  Filled 2020-05-29: qty 2

## 2020-05-29 MED ORDER — LIDOCAINE HCL (PF) 1 % IJ SOLN
INTRAMUSCULAR | Status: AC | PRN
  Administered 2020-05-29: 10 mL via INTRADERMAL

## 2020-05-29 MED ORDER — GELATIN ABSORBABLE 12-7 MM EX MISC
CUTANEOUS | Status: AC
  Filled 2020-05-29: qty 1

## 2020-05-29 MED ORDER — MIDAZOLAM HCL 2 MG/2ML IJ SOLN
INTRAMUSCULAR | Status: AC | PRN
  Administered 2020-05-29 (×2): 2 mg via INTRAVENOUS

## 2020-05-29 NOTE — Procedures (Signed)
Pre Procedure Dx: Elevated LFTs of uncertain etiology Post Procedural Dx: Same  Technically successful US guided biopsy of right lobe of the liver.  EBL: None No immediate complications.   Jay Harley Mccartney, MD Pager #: [phone]    

## 2020-05-29 NOTE — Consult Note (Addendum)
Chief Complaint: Patient was seen in consultation today for image guided random core liver biopsy Chief Complaint  Patient presents with  . Emesis  . Nausea    Referring Physician(s): Burnard Bunting  Supervising Physician: Simonne Come  Patient Status: [facility] - In-pt  History of Present Illness: James Mcgee is a 30 y.o. male with past medical history of hypertension, alcohol and tobacco use who was admitted to [facility] on 5/1 with persistent nausea and vomiting, some dyspnea with exertion, body aches and jaundice.  Patient had been using Tylenol and NyQuil prior to admission.  Subsequent laboratory evaluation revealed markedly elevated LFTs which are steadily decreasing now, negative urine drug screen, COVID-19 negative, negative acute hepatitis panel.  Abdominal ultrasound revealed contracted gallbladder with no signs of cholecystitis or gallstones.  No bile duct dilatation.  Fatty liver noted.  MRI of the abdomen revealed no choledocholithiasis or biliary obstruction,  normal biliary tree, normal gallbladder, 2 benign-appearing hepatic lesions in typical locations of benign focal fatty infiltration.  Request now received from GI team for image guided random core liver biopsy for further evaluation.  Past Medical History:  Diagnosis Date  . Hypertension     History reviewed. No pertinent surgical history.  Allergies: Patient has no known allergies.  Medications: Prior to Admission medications   Not on File     Family History  Problem Relation Age of Onset  . Hypertension Father   . Cancer Father     Social History   Socioeconomic History  . Marital status: Single    Spouse name: Not on file  . Number of children: Not on file  . Years of education: Not on file  . Highest education level: Not on file  Occupational History  . Not on file  Tobacco Use  . Smoking status: Current Some Day Smoker    Types: Cigars  . Smokeless tobacco: Never Used  . Tobacco  comment: Used to smoke cigarettes  Vaping Use  . Vaping Use: Former  Substance and Sexual Activity  . Alcohol use: Yes    Alcohol/week: 10.0 standard drinks    Types: 10 Shots of liquor per week  . Drug use: Not Currently  . Sexual activity: Not on file  Other Topics Concern  . Not on file  Social History Narrative  . Not on file   Social Determinants of Health   Financial Resource Strain: Not on file  Food Insecurity: Not on file  Transportation Needs: Not on file  Physical Activity: Not on file  Stress: Not on file  Social Connections: Not on file      Review of Systems see above; currently denies fever, headache, chest pain, worsening respiratory symptoms, abdominal/back pain, nausea, vomiting or bleeding  Vital Signs: BP 128/88 (BP Location: Left Arm)   Pulse 86   Temp 97.9 F (36.6 C) (Oral)   Resp 16   Ht 6' (1.829 m)   Wt 209 lb 14.4 oz (95.2 kg)   SpO2 99%   BMI 28.47 kg/m   Physical Exam awake, alert.  Scleral icterus noted; chest clear to auscultation bilaterally.  Heart with regular rate and rhythm.  Abdomen soft, positive bowel sounds, currently nontender.  No lower extremity edema.  Imaging: DG Chest 2 View  Result Date: 05/27/2020 CLINICAL DATA:  Chest tightness EXAM: CHEST - 2 VIEW COMPARISON:  None. FINDINGS: The heart size and mediastinal contours are within normal limits. Both lungs are clear. The visualized skeletal structures are unremarkable. IMPRESSION: No  active cardiopulmonary disease. Electronically Signed   By: Gerome Sam III M.D   On: 05/27/2020 11:59   MR 3D Recon At Scanner  Result Date: 05/28/2020 CLINICAL DATA:  Jaundice, abdominal pain. Evaluate for choledochal lithiasis EXAM: MRI ABDOMEN WITHOUT AND WITH CONTRAST (INCLUDING MRCP) TECHNIQUE: Multiplanar multisequence MR imaging of the abdomen was performed both before and after the administration of intravenous contrast. Heavily T2-weighted images of the biliary and pancreatic ducts  were obtained, and three-dimensional MRCP images were rendered by post processing. CONTRAST:  26mL GADAVIST GADOBUTROL 1 MMOL/ML IV SOLN COMPARISON:  None. FINDINGS: Lower chest:  Lung bases are clear. Hepatobiliary: no intrahepatic biliary duct dilatation. No extrahepatic biliary duct dilatation. The gallbladder common bile duct is thin at 0.3 mm. No filling defects within common bile duct. No gallstones evident within the gall bladder Within the anterior aspect of the LEFT hepatic lobe just anterior to the falciform ligament there is a geographic region of low signal intensity on T2 weighted imaging measuring 2.5 x 1.7 cm (image 14/series 17. This lesion is hypoenhancing on postcontrast T1 weighted imaging (series 28 29. Lesion is slightly hyperintense on precontrast T1 weighted imaging. Lesion identified lens in character cysts adjacent the gallbladder fossa (image 18/series 16) measuring 1 cm) Pancreas: Normal pancreatic parenchymal intensity. No ductal dilatation or inflammation. Spleen: Normal spleen. Adrenals/urinary tract: Adrenal glands and kidneys are normal. Stomach/Bowel: Stomach and limited of the small bowel is unremarkable Vascular/Lymphatic: Abdominal aortic normal caliber. No retroperitoneal periportal lymphadenopathy. Musculoskeletal: No aggressive osseous lesion IMPRESSION: 1. No choledocholithiasis. No biliary obstruction. Normal biliary tree. 2. Normal gallbladder. 3. Two benign appearing hepatic lesions in typical locations of benign focal fatty infiltration. Electronically Signed   By: Genevive Bi M.D.   On: 05/28/2020 11:55   MR ABDOMEN MRCP W WO CONTAST  Result Date: 05/28/2020 CLINICAL DATA:  Jaundice, abdominal pain. Evaluate for choledochal lithiasis EXAM: MRI ABDOMEN WITHOUT AND WITH CONTRAST (INCLUDING MRCP) TECHNIQUE: Multiplanar multisequence MR imaging of the abdomen was performed both before and after the administration of intravenous contrast. Heavily T2-weighted images of  the biliary and pancreatic ducts were obtained, and three-dimensional MRCP images were rendered by post processing. CONTRAST:  68mL GADAVIST GADOBUTROL 1 MMOL/ML IV SOLN COMPARISON:  None. FINDINGS: Lower chest:  Lung bases are clear. Hepatobiliary: no intrahepatic biliary duct dilatation. No extrahepatic biliary duct dilatation. The gallbladder common bile duct is thin at 0.3 mm. No filling defects within common bile duct. No gallstones evident within the gall bladder Within the anterior aspect of the LEFT hepatic lobe just anterior to the falciform ligament there is a geographic region of low signal intensity on T2 weighted imaging measuring 2.5 x 1.7 cm (image 14/series 17. This lesion is hypoenhancing on postcontrast T1 weighted imaging (series 28 29. Lesion is slightly hyperintense on precontrast T1 weighted imaging. Lesion identified lens in character cysts adjacent the gallbladder fossa (image 18/series 16) measuring 1 cm) Pancreas: Normal pancreatic parenchymal intensity. No ductal dilatation or inflammation. Spleen: Normal spleen. Adrenals/urinary tract: Adrenal glands and kidneys are normal. Stomach/Bowel: Stomach and limited of the small bowel is unremarkable Vascular/Lymphatic: Abdominal aortic normal caliber. No retroperitoneal periportal lymphadenopathy. Musculoskeletal: No aggressive osseous lesion IMPRESSION: 1. No choledocholithiasis. No biliary obstruction. Normal biliary tree. 2. Normal gallbladder. 3. Two benign appearing hepatic lesions in typical locations of benign focal fatty infiltration. Electronically Signed   By: Genevive Bi M.D.   On: 05/28/2020 11:55   US Abdomen Limited RUQ (LIVER/GB)  Result Date: 05/27/2020 CLINICAL DATA:  Generalized abdominal pain, nausea and vomiting for 4 days. EXAM: ULTRASOUND ABDOMEN LIMITED RIGHT UPPER QUADRANT COMPARISON:  None. FINDINGS: Gallbladder: Gallbladder is contracted. Gallbladder wall thickness measures 5 mm, likely accentuated by the  contraction. No discrete gallstones. No pericholecystic fluid. No sonographic Murphy sign elicited during the exam. Common bile duct: Diameter: 2 mm Liver: Liver is echogenic indicating fatty infiltration. No focal mass or lesion is seen within the liver. Portal vein is patent on color Doppler imaging with normal direction of blood flow towards the liver. Other: None. IMPRESSION: 1. Gallbladder is contracted, limiting characterization. No secondary signs of acute cholecystitis. No gallstones identified. 2. No bile duct dilatation appreciated. 3. Fatty infiltration of the liver. 4. Given the history of jaundice, consider more definitive characterization with MRCP. Electronically Signed   By: Bary RichardStan  Maynard M.D.   On: 05/27/2020 14:31    Labs:  CBC: Recent Labs    05/27/20 1102 05/28/20 0436  WBC 7.5 5.3  HGB 18.6* 15.7  HCT 55.7* 47.2  PLT 265 210    COAGS: Recent Labs    05/27/20 1247 05/28/20 0436 05/29/20 0437  INR 2.6* 1.9* 1.4*  APTT 33  --   --     BMP: Recent Labs    05/27/20 1102 05/28/20 0436 05/29/20 0437  NA 135 140 140  K 3.9 3.5 3.9  CL 99 106 104  CO2 21* 26 27  GLUCOSE 86 106* 101*  BUN 14 10 11   CALCIUM 9.9 8.9 9.3  CREATININE 0.69 0.75 0.91  GFRNONAA >60 >60 >60    LIVER FUNCTION TESTS: Recent Labs    05/27/20 1102 05/28/20 0436 05/29/20 0437  BILITOT 10.3* 7.2* 7.1*  AST 1,929* 686* 293*  ALT 5,698* 2,163* 1,594*  ALKPHOS 171* 125 121  PROT 7.0 5.4* 5.8*  ALBUMIN 4.4 3.5 3.6    TUMOR MARKERS: No results for input(s): AFPTM, CEA, CA199, CHROMGRNA in the last 8760 hours.  Assessment and Plan: 30 y.o. male with past medical history of hypertension, alcohol and tobacco use who [facility] with persistent nausea and vomiting, some dyspnea with exertion, body aches and jaundice.  Patient had been using Tylenol and NyQuil prior to admission.  Subsequent laboratory evaluation revealed markedly elevated LFTs which are  steadily decreasing now, negative urine drug screen, COVID-19 negative, negative acute hepatitis panel, unremarkable acetaminophen/ethanol levels.  PT 16.7, INR 1.4, WBC normal, hemoglobin normal, platelet count normal ; abdominal ultrasound revealed contracted gallbladder with no signs of cholecystitis or gallstones.  No bile duct dilatation.  Fatty liver noted.  MRI of the abdomen revealed no choledocholithiasis or biliary obstruction,  normal biliary tree, normal gallbladder, 2 benign-appearing hepatic lesions in typical locations of benign focal fatty infiltration.  Request now received from GI team for image guided random core liver biopsy for further evaluation. Imaging studies have been reviewed by Dr. Grace IsaacWatts.  Risks and benefits of procedure was discussed with the patient/mother including, but not limited to bleeding, infection, damage to adjacent structures or low yield requiring additional tests.  All of the questions were answered and there is agreement to proceed.  Consent signed and in chart.  Procedure scheduled for this afternoon   Thank you for this interesting consult.  I greatly enjoyed meeting Jennet Maduroaylor Neeb and look forward to participating in their care.  A copy of this report was sent to the requesting provider on this date.  Electronically Signed: D. Jeananne RamaKevin Mallissa Lorenzen, PA-C 05/29/2020, 11:21 AM   I  spent a total of  25 minutes   in face to face in clinical consultation, greater than 50% of which was counseling/coordinating care for image guided random core liver biopsy

## 2020-05-29 NOTE — Discharge Summary (Incomplete)
Physician Discharge Summary  James Mcgee [name]:[id number] DOB: [birth year] DOA: 05/27/2020  PCP: Pcp, No  Admit date: 05/27/2020 Discharge date: 05/29/2020  Admitted From: *** Disposition: ***  Recommendations for Outpatient Follow-up:  1. Follow up with PCP in 1 week*** 2. Please obtain BMP/CBC in one week*** 3. Please follow up on the following pending results: ***  Home Health: *** Equipment/Devices: ***  Discharge Condition: *** CODE STATUS: *** Diet recommendation: ***   Brief/Interim Summary:  Admission HPI written by Teddy Spike, DO   [facility] course:  ***  Discharge Diagnoses:  Principal Problem:   Acute liver failure Active Problems:   Primary hypertension    Discharge Instructions   Allergies as of 05/29/2020   No Known Allergies   Med Rec must be completed prior to using this SMARTLINK***       No Known Allergies  Consultations:  ***   Procedures/Studies: DG Chest 2 View  Result Date: 05/27/2020 CLINICAL DATA:  Chest tightness EXAM: CHEST - 2 VIEW COMPARISON:  None. FINDINGS: The heart size and mediastinal contours are within normal limits. Both lungs are clear. The visualized skeletal structures are unremarkable. IMPRESSION: No active cardiopulmonary disease. Electronically Signed   By: Gerome Sam III M.D   On: 05/27/2020 11:59   MR 3D Recon At Scanner  Result Date: 05/28/2020 CLINICAL DATA:  Jaundice, abdominal pain. Evaluate for choledochal lithiasis EXAM: MRI ABDOMEN WITHOUT AND WITH CONTRAST (INCLUDING MRCP) TECHNIQUE: Multiplanar multisequence MR imaging of the abdomen was performed both before and after the administration of intravenous contrast. Heavily T2-weighted images of the biliary and pancreatic ducts were obtained, and three-dimensional MRCP images were rendered by post processing. CONTRAST:  36mL GADAVIST GADOBUTROL 1 MMOL/ML IV SOLN COMPARISON:  None. FINDINGS: Lower chest:  Lung bases are clear. Hepatobiliary:  no intrahepatic biliary duct dilatation. No extrahepatic biliary duct dilatation. The gallbladder common bile duct is thin at 0.3 mm. No filling defects within common bile duct. No gallstones evident within the gall bladder Within the anterior aspect of the LEFT hepatic lobe just anterior to the falciform ligament there is a geographic region of low signal intensity on T2 weighted imaging measuring 2.5 x 1.7 cm (image 14/series 17. This lesion is hypoenhancing on postcontrast T1 weighted imaging (series 28 29. Lesion is slightly hyperintense on precontrast T1 weighted imaging. Lesion identified lens in character cysts adjacent the gallbladder fossa (image 18/series 16) measuring 1 cm) Pancreas: Normal pancreatic parenchymal intensity. No ductal dilatation or inflammation. Spleen: Normal spleen. Adrenals/urinary tract: Adrenal glands and kidneys are normal. Stomach/Bowel: Stomach and limited of the small bowel is unremarkable Vascular/Lymphatic: Abdominal aortic normal caliber. No retroperitoneal periportal lymphadenopathy. Musculoskeletal: No aggressive osseous lesion IMPRESSION: 1. No choledocholithiasis. No biliary obstruction. Normal biliary tree. 2. Normal gallbladder. 3. Two benign appearing hepatic lesions in typical locations of benign focal fatty infiltration. Electronically Signed   By: Genevive Bi M.D.   On: 05/28/2020 11:55   MR ABDOMEN MRCP W WO CONTAST  Result Date: 05/28/2020 CLINICAL DATA:  Jaundice, abdominal pain. Evaluate for choledochal lithiasis EXAM: MRI ABDOMEN WITHOUT AND WITH CONTRAST (INCLUDING MRCP) TECHNIQUE: Multiplanar multisequence MR imaging of the abdomen was performed both before and after the administration of intravenous contrast. Heavily T2-weighted images of the biliary and pancreatic ducts were obtained, and three-dimensional MRCP images were rendered by post processing. CONTRAST:  62mL GADAVIST GADOBUTROL 1 MMOL/ML IV SOLN COMPARISON:  None. FINDINGS: Lower chest:  Lung  bases are clear. Hepatobiliary: no  intrahepatic biliary duct dilatation. No extrahepatic biliary duct dilatation. The gallbladder common bile duct is thin at 0.3 mm. No filling defects within common bile duct. No gallstones evident within the gall bladder Within the anterior aspect of the LEFT hepatic lobe just anterior to the falciform ligament there is a geographic region of low signal intensity on T2 weighted imaging measuring 2.5 x 1.7 cm (image 14/series 17. This lesion is hypoenhancing on postcontrast T1 weighted imaging (series 28 29. Lesion is slightly hyperintense on precontrast T1 weighted imaging. Lesion identified lens in character cysts adjacent the gallbladder fossa (image 18/series 16) measuring 1 cm) Pancreas: Normal pancreatic parenchymal intensity. No ductal dilatation or inflammation. Spleen: Normal spleen. Adrenals/urinary tract: Adrenal glands and kidneys are normal. Stomach/Bowel: Stomach and limited of the small bowel is unremarkable Vascular/Lymphatic: Abdominal aortic normal caliber. No retroperitoneal periportal lymphadenopathy. Musculoskeletal: No aggressive osseous lesion IMPRESSION: 1. No choledocholithiasis. No biliary obstruction. Normal biliary tree. 2. Normal gallbladder. 3. Two benign appearing hepatic lesions in typical locations of benign focal fatty infiltration. Electronically Signed   By: Genevive BiStewart  Edmunds M.D.   On: 05/28/2020 11:55   US Abdomen Limited RUQ (LIVER/GB)  Result Date: 05/27/2020 CLINICAL DATA:  Generalized abdominal pain, nausea and vomiting for 4 days. EXAM: ULTRASOUND ABDOMEN LIMITED RIGHT UPPER QUADRANT COMPARISON:  None. FINDINGS: Gallbladder: Gallbladder is contracted. Gallbladder wall thickness measures 5 mm, likely accentuated by the contraction. No discrete gallstones. No pericholecystic fluid. No sonographic Murphy sign elicited during the exam. Common bile duct: Diameter: 2 mm Liver: Liver is echogenic indicating fatty infiltration. No focal mass or  lesion is seen within the liver. Portal vein is patent on color Doppler imaging with normal direction of blood flow towards the liver. Other: None. IMPRESSION: 1. Gallbladder is contracted, limiting characterization. No secondary signs of acute cholecystitis. No gallstones identified. 2. No bile duct dilatation appreciated. 3. Fatty infiltration of the liver. 4. Given the history of jaundice, consider more definitive characterization with MRCP. Electronically Signed   By: Bary RichardStan  Maynard M.D.   On: 05/27/2020 14:31     ***   Subjective: ***  Discharge Exam: Vitals:   05/28/20 2156 05/29/20 0643  BP: 124/78 123/76  Pulse: 85 73  Resp: 20 20  Temp: 98.3 F (36.8 C) 97.9 F (36.6 C)  SpO2: 97% 97%   Vitals:   05/28/20 0553 05/28/20 1228 05/28/20 2156 05/29/20 0643  BP: 127/83 (!) 140/92 124/78 123/76  Pulse: 86 (!) 109 85 73  Resp: 20 16 20 20   Temp: 98.3 F (36.8 C) 98.2 F (36.8 C) 98.3 F (36.8 C) 97.9 F (36.6 C)  TempSrc: Oral Oral Oral Oral  SpO2: 97% 98% 97% 97%  Weight:      Height:        General: Pt is alert, awake, not in acute distress*** Cardiovascular: RRR, S1/S2 +, no rubs, no gallops Respiratory: CTA bilaterally, no wheezing, no rhonchi Abdominal: Soft, NT, ND, bowel sounds + Extremities: no edema, no cyanosis    The results of significant diagnostics from this hospitalization (including imaging, microbiology, ancillary and laboratory) are listed below for reference.     Microbiology: Recent Results (from the past 240 hour(s))  Resp Panel by RT-PCR (Flu A&B, Covid) Nasopharyngeal Swab     Status: None   Collection Time: 05/27/20 12:19 PM   Specimen: Nasopharyngeal Swab; Nasopharyngeal(NP) swabs in vial transport medium  Result Value Ref Range Status   SARS Coronavirus 2 by RT PCR NEGATIVE NEGATIVE Final    Comment: (  NOTE) SARS-CoV-2 target nucleic acids are NOT DETECTED.  The SARS-CoV-2 RNA is generally detectable in upper respiratory specimens  during the acute phase of infection. The lowest concentration of SARS-CoV-2 viral copies this assay can detect is 138 copies/mL. A negative result does not preclude SARS-Cov-2 infection and should not be used as the sole basis for treatment or other patient management decisions. A negative result may occur with  improper specimen collection/handling, submission of specimen other than nasopharyngeal swab, presence of viral mutation(s) within the areas targeted by this assay, and inadequate number of viral copies(<138 copies/mL). A negative result must be combined with clinical observations, patient history, and epidemiological information. The expected result is Negative.  Fact Sheet for Patients:  BloggerCourse.com  Fact Sheet for Healthcare Providers:  SeriousBroker.it  This test is no t yet approved or cleared by the [place] FDA and  has been authorized for detection and/or diagnosis of SARS-CoV-2 by FDA under an Emergency Use Authorization (EUA). This EUA will remain  in effect (meaning this test can be used) for the duration of the COVID-19 declaration under Section 564(b)(1) of the Act, 21 U.S.C.section 360bbb-3(b)(1), unless the authorization is terminated  or revoked sooner.       Influenza A by PCR NEGATIVE NEGATIVE Final   Influenza B by PCR NEGATIVE NEGATIVE Final    Comment: (NOTE) The Xpert Xpress SARS-CoV-2/FLU/RSV plus assay is intended as an aid in the diagnosis of influenza from Nasopharyngeal swab specimens and should not be used as a sole basis for treatment. Nasal washings and aspirates are unacceptable for Xpert Xpress SARS-CoV-2/FLU/RSV testing.  Fact Sheet for Patients: BloggerCourse.com  Fact Sheet for Healthcare Providers: SeriousBroker.it  This test is not yet approved or cleared by the [place] FDA and has been authorized for detection  and/or diagnosis of SARS-CoV-2 by FDA under an Emergency Use Authorization (EUA). This EUA will remain in effect (meaning this test can be used) for the duration of the COVID-19 declaration under Section 564(b)(1) of the Act, 21 U.S.C. section 360bbb-3(b)(1), unless the authorization is terminated or revoked.  Performed at [facility], [address] [place], [place] [address]      Labs: BNP (last 3 results) No results for input(s): BNP in the last 8760 hours. Basic Metabolic Panel: Recent Labs  Lab 05/27/20 1102 05/28/20 0436 05/29/20 0437  NA 135 140 140  K 3.9 3.5 3.9  CL 99 106 104  CO2 21* 26 27  GLUCOSE 86 106* 101*  BUN 14 10 11   CREATININE 0.69 0.75 0.91  CALCIUM 9.9 8.9 9.3   Liver Function Tests: Recent Labs  Lab 05/27/20 1102 05/28/20 0436 05/29/20 0437  AST 1,929* 686* 293*  ALT 5,698* 2,163* 1,594*  ALKPHOS 171* 125 121  BILITOT 10.3* 7.2* 7.1*  PROT 7.0 5.4* 5.8*  ALBUMIN 4.4 3.5 3.6   Recent Labs  Lab 05/27/20 1247  LIPASE 62*   No results for input(s): AMMONIA in the last 168 hours. CBC: Recent Labs  Lab 05/27/20 1102 05/28/20 0436  WBC 7.5 5.3  HGB 18.6* 15.7  HCT 55.7* 47.2  MCV 95.5 98.1  PLT 265 210   Cardiac Enzymes: No results for input(s): CKTOTAL, CKMB, CKMBINDEX, TROPONINI in the last 168 hours. BNP: Invalid input(s): POCBNP CBG: No results for input(s): GLUCAP in the last 168 hours. D-Dimer No results for input(s): DDIMER in the last 72 hours. Hgb A1c No results for input(s): HGBA1C in the last 72 hours. Lipid Profile No results  for input(s): CHOL, HDL, LDLCALC, TRIG, CHOLHDL, LDLDIRECT in the last 72 hours. Thyroid function studies No results for input(s): TSH, T4TOTAL, T3FREE, THYROIDAB in the last 72 hours.  Invalid input(s): FREET3 Anemia work up No results for input(s): VITAMINB12, FOLATE, FERRITIN, TIBC, IRON, RETICCTPCT in the last 72 hours. Urinalysis    Component Value Date/Time    COLORURINE AMBER (A) 05/27/2020 1530   APPEARANCEUR CLEAR 05/27/2020 1530   LABSPEC 1.020 05/27/2020 1530   PHURINE 5.5 05/27/2020 1530   GLUCOSEU NEGATIVE 05/27/2020 1530   HGBUR NEGATIVE 05/27/2020 1530   BILIRUBINUR LARGE (A) 05/27/2020 1530   KETONESUR NEGATIVE 05/27/2020 1530   PROTEINUR NEGATIVE 05/27/2020 1530   NITRITE NEGATIVE 05/27/2020 1530   LEUKOCYTESUR NEGATIVE 05/27/2020 1530   Sepsis Labs Invalid input(s): PROCALCITONIN,  WBC,  LACTICIDVEN Microbiology Recent Results (from the past 240 hour(s))  Resp Panel by RT-PCR (Flu A&B, Covid) Nasopharyngeal Swab     Status: None   Collection Time: 05/27/20 12:19 PM   Specimen: Nasopharyngeal Swab; Nasopharyngeal(NP) swabs in vial transport medium  Result Value Ref Range Status   SARS Coronavirus 2 by RT PCR NEGATIVE NEGATIVE Final    Comment: (NOTE) SARS-CoV-2 target nucleic acids are NOT DETECTED.  The SARS-CoV-2 RNA is generally detectable in upper respiratory specimens during the acute phase of infection. The lowest concentration of SARS-CoV-2 viral copies this assay can detect is 138 copies/mL. A negative result does not preclude SARS-Cov-2 infection and should not be used as the sole basis for treatment or other patient management decisions. A negative result may occur with  improper specimen collection/handling, submission of specimen other than nasopharyngeal swab, presence of viral mutation(s) within the areas targeted by this assay, and inadequate number of viral copies(<138 copies/mL). A negative result must be combined with clinical observations, patient history, and epidemiological information. The expected result is Negative.  Fact Sheet for Patients:  BloggerCourse.com  Fact Sheet for Healthcare Providers:  SeriousBroker.it  This test is no t yet approved or cleared by the [place] FDA and  has been authorized for detection and/or diagnosis of  SARS-CoV-2 by FDA under an Emergency Use Authorization (EUA). This EUA will remain  in effect (meaning this test can be used) for the duration of the COVID-19 declaration under Section 564(b)(1) of the Act, 21 U.S.C.section 360bbb-3(b)(1), unless the authorization is terminated  or revoked sooner.       Influenza A by PCR NEGATIVE NEGATIVE Final   Influenza B by PCR NEGATIVE NEGATIVE Final    Comment: (NOTE) The Xpert Xpress SARS-CoV-2/FLU/RSV plus assay is intended as an aid in the diagnosis of influenza from Nasopharyngeal swab specimens and should not be used as a sole basis for treatment. Nasal washings and aspirates are unacceptable for Xpert Xpress SARS-CoV-2/FLU/RSV testing.  Fact Sheet for Patients: BloggerCourse.com  Fact Sheet for Healthcare Providers: SeriousBroker.it  This test is not yet approved or cleared by the [place] FDA and has been authorized for detection and/or diagnosis of SARS-CoV-2 by FDA under an Emergency Use Authorization (EUA). This EUA will remain in effect (meaning this test can be used) for the duration of the COVID-19 declaration under Section 564(b)(1) of the Act, 21 U.S.C. section 360bbb-3(b)(1), unless the authorization is terminated or revoked.  Performed at [facility], [address] [place], [place] [address]      Time coordinating discharge: Over 30 minutes***  SIGNED:   Jacquelin Hawking, MD [facility] 05/29/2020, 7:47 AM

## 2020-05-29 NOTE — Progress Notes (Addendum)
[facility] Gastroenterology Progress Note  James Mcgee 29 y.o. 1990-02-22  CC:  Jaundice  Subjective: Patient reports feeling weak. Denies abdominal pain, nausea, vomiting.  ROS : Review of Systems  Gastrointestinal: Negative for abdominal pain, blood in stool, constipation, diarrhea, heartburn, melena, nausea and vomiting.  Neurological: Positive for weakness. Negative for loss of consciousness.   Objective: Vital signs in last 24 hours: Vitals:   05/28/20 2156 05/29/20 0643  BP: 124/78 123/76  Pulse: 85 73  Resp: 20 20  Temp: 98.3 F (36.8 C) 97.9 F (36.6 C)  SpO2: 97% 97%    Physical Exam:  General:  Alert, oriented, cooperative, no distress, jaundiced  Head:  Normocephalic, without obvious abnormality, atraumatic  Eyes:  Scleral icterus, EOMs intact  Lungs:   Clear to auscultation bilaterally, respirations unlabored  Heart:  Regular rate and rhythm, S1, S2 normal  Abdomen:   Soft, non-tender, non-distended, bowel sounds active all four quadrants  Extremities: Extremities normal, atraumatic, no  edema    Lab Results: Recent Labs    05/28/20 0436 05/29/20 0437  NA 140 140  K 3.5 3.9  CL 106 104  CO2 26 27  GLUCOSE 106* 101*  BUN 10 11  CREATININE 0.75 0.91  CALCIUM 8.9 9.3   Recent Labs    05/28/20 0436 05/29/20 0437  AST 686* 293*  ALT 2,163* 1,594*  ALKPHOS 125 121  BILITOT 7.2* 7.1*  PROT 5.4* 5.8*  ALBUMIN 3.5 3.6   Recent Labs    05/27/20 1102 05/28/20 0436  WBC 7.5 5.3  HGB 18.6* 15.7  HCT 55.7* 47.2  MCV 95.5 98.1  PLT 265 210   Recent Labs    05/28/20 0436 05/29/20 0437  LABPROT 22.2* 16.7*  INR 1.9* 1.4*      Assessment: Jaundice: unknown origin, though still suspicious for viral etiology.  Acute hepatitis panel negative.  HSV, CMV, EBV pending. -T. Bili 7.1, stable -AST 293, improved from 686 yesterday -ALT 1594, improved from 2163 yesterday -INR 1.4, decreased from 1.9 yesterday -No leukocytosis (WBCs 5.3) as of  5/2 -MRI/MRCP negative for CBD obstruction  Plan: Await EBV IgM, HSV IgM, and CMV IgM.  Continue supportive care. Continue to trend LFTs and INR.  [facility] will follow.  Edrick Kins PA-C 05/29/2020, 8:53 AM  Contact #  [phone]

## 2020-05-29 NOTE — Progress Notes (Signed)
PROGRESS NOTE    James Mcgee  ZOX:[id number] DOB: 01/05/1991 DOA: 05/27/2020 PCP: Oneita HurtPcp, No   Brief Narrative: James Mcgee is a 30 y.o. male with a history of hypertension. He presented secondary to persistent nausea/vomiting and found to have evidence of acute liver failure. Workup started. GI consulted.   Assessment & Plan:   Principal Problem:   Acute liver failure Active Problems:   Primary hypertension   Acute hepatic failure Unknown etiology at this time. Elevated AST/ALT/bilirubin of 1929/5698/103 respectively. Possibly viral hepatitis, drug induced. Acute hepatitis panel is negative, CMV IgM undetectable. Tylenol level negative. AST/ALT/bilirubin trending down. No hypotension noted. Obstruction is a possibility but unsure AST/ALT would be this significant in extrahepatic etiology. Ultrasound significant for fatty liver disease. GI consulted on admission. MRCP without etiology for acute liver pathology. -GI recommendations: random liver biopsy -Daily CMP, INR -EBV pending  Hypertension Patient is currently not on medication management.  Currently uncontrolled -Hydralazine as needed  Fatty liver disease Patient states that this is hereditary.  Patient has a history of alcohol use.  Unsure if etiology.  Patient is overweight but not obese.  Discussed decreasing alcohol consumption.  Lightheadedness -Check orthostatic vitals; if orthostatic, give 1 L NS bolus   DVT prophylaxis: SCDs Code Status:   Code Status: Full Code Family Communication: Mother at bedside Disposition Plan: Discharge likely in 24 hours pending biopsy and GI final recommendations   Consultants:   Gastroenterology Deboraha Sprang([facility])  Procedures:   None  Antimicrobials:  None    Subjective: Lightheadedness with ambulation, otherwise no concerns. No abdominal pain. No nausea or vomiting.  Objective: Vitals:   05/29/20 0643 05/29/20 1104 05/29/20 1105 05/29/20 1106  BP: 123/76 (!) 138/99  (!) 135/102 128/88  Pulse: 73 82 93 86  Resp: 20 16 18 16   Temp: 97.9 F (36.6 C)     TempSrc: Oral     SpO2: 97% 98% 99% 99%  Weight:      Height:       No intake or output data in the 24 hours ending 05/29/20 1208 Filed Weights   05/27/20 1032 05/27/20 1722  Weight: 95.3 kg 95.2 kg    Examination:  General exam: Appears calm and comfortable Eyes: Scleral icterus Respiratory system: Clear to auscultation. Respiratory effort normal. Cardiovascular system: S1 & S2 heard, RRR. No murmurs, rubs, gallops or clicks. Gastrointestinal system: Abdomen is nondistended, soft and nontender. No organomegaly or masses felt. Normal bowel sounds heard. Central nervous system: Alert and oriented. No focal neurological deficits. Musculoskeletal: No edema. No calf tenderness Skin: No cyanosis. No rashes Psychiatry: Judgement and insight appear normal. Mood & affect appropriate.     Data Reviewed: I have personally reviewed following labs and imaging studies  CBC Lab Results  Component Value Date   WBC 5.3 05/28/2020   RBC 4.81 05/28/2020   HGB 15.7 05/28/2020   HCT 47.2 05/28/2020   MCV 98.1 05/28/2020   MCH 32.6 05/28/2020   PLT 210 05/28/2020   MCHC 33.3 05/28/2020   RDW 13.1 05/28/2020     Last metabolic panel Lab Results  Component Value Date   NA 140 05/29/2020   K 3.9 05/29/2020   CL 104 05/29/2020   CO2 27 05/29/2020   BUN 11 05/29/2020   CREATININE 0.91 05/29/2020   GLUCOSE 101 (H) 05/29/2020   GFRNONAA >60 05/29/2020   CALCIUM 9.3 05/29/2020   PROT 5.8 (L) 05/29/2020   ALBUMIN 3.6 05/29/2020   BILITOT 7.1 (H) 05/29/2020  ALKPHOS 121 05/29/2020   AST 293 (H) 05/29/2020   ALT 1,594 (H) 05/29/2020   ANIONGAP 9 05/29/2020    CBG (last 3)  No results for input(s): GLUCAP in the last 72 hours.   GFR: Estimated Creatinine Clearance: 144.6 mL/min (by C-G formula based on SCr of 0.91 mg/dL).  Coagulation Profile: Recent Labs  Lab 05/27/20 1247  05/28/20 0436 05/29/20 0437  INR 2.6* 1.9* 1.4*    Recent Results (from the past 240 hour(s))  Resp Panel by RT-PCR (Flu A&B, Covid) Nasopharyngeal Swab     Status: None   Collection Time: 05/27/20 12:19 PM   Specimen: Nasopharyngeal Swab; Nasopharyngeal(NP) swabs in vial transport medium  Result Value Ref Range Status   SARS Coronavirus 2 by RT PCR NEGATIVE NEGATIVE Final    Comment: (NOTE) SARS-CoV-2 target nucleic acids are NOT DETECTED.  The SARS-CoV-2 RNA is generally detectable in upper respiratory specimens during the acute phase of infection. The lowest concentration of SARS-CoV-2 viral copies this assay can detect is 138 copies/mL. A negative result does not preclude SARS-Cov-2 infection and should not be used as the sole basis for treatment or other patient management decisions. A negative result may occur with  improper specimen collection/handling, submission of specimen other than nasopharyngeal swab, presence of viral mutation(s) within the areas targeted by this assay, and inadequate number of viral copies(<138 copies/mL). A negative result must be combined with clinical observations, patient history, and epidemiological information. The expected result is Negative.  Fact Sheet for Patients:  BloggerCourse.com  Fact Sheet for Healthcare Providers:  SeriousBroker.it  This test is no t yet approved or cleared by the [place] FDA and  has been authorized for detection and/or diagnosis of SARS-CoV-2 by FDA under an Emergency Use Authorization (EUA). This EUA will remain  in effect (meaning this test can be used) for the duration of the COVID-19 declaration under Section 564(b)(1) of the Act, 21 U.S.C.section 360bbb-3(b)(1), unless the authorization is terminated  or revoked sooner.       Influenza A by PCR NEGATIVE NEGATIVE Final   Influenza B by PCR NEGATIVE NEGATIVE Final    Comment: (NOTE) The Xpert  Xpress SARS-CoV-2/FLU/RSV plus assay is intended as an aid in the diagnosis of influenza from Nasopharyngeal swab specimens and should not be used as a sole basis for treatment. Nasal washings and aspirates are unacceptable for Xpert Xpress SARS-CoV-2/FLU/RSV testing.  Fact Sheet for Patients: BloggerCourse.com  Fact Sheet for Healthcare Providers: SeriousBroker.it  This test is not yet approved or cleared by the [place] FDA and has been authorized for detection and/or diagnosis of SARS-CoV-2 by FDA under an Emergency Use Authorization (EUA). This EUA will remain in effect (meaning this test can be used) for the duration of the COVID-19 declaration under Section 564(b)(1) of the Act, 21 U.S.C. section 360bbb-3(b)(1), unless the authorization is terminated or revoked.  Performed at [facility], [address], [place], [place] [address]         Radiology Studies: MR 3D Recon At Scanner  Result Date: 05/28/2020 CLINICAL DATA:  Jaundice, abdominal pain. Evaluate for choledochal lithiasis EXAM: MRI ABDOMEN WITHOUT AND WITH CONTRAST (INCLUDING MRCP) TECHNIQUE: Multiplanar multisequence MR imaging of the abdomen was performed both before and after the administration of intravenous contrast. Heavily T2-weighted images of the biliary and pancreatic ducts were obtained, and three-dimensional MRCP images were rendered by post processing. CONTRAST:  35mL GADAVIST GADOBUTROL 1 MMOL/ML IV SOLN COMPARISON:  None. FINDINGS: Lower chest:  Lung bases are clear. Hepatobiliary: no intrahepatic biliary duct dilatation. No extrahepatic biliary duct dilatation. The gallbladder common bile duct is thin at 0.3 mm. No filling defects within common bile duct. No gallstones evident within the gall bladder Within the anterior aspect of the LEFT hepatic lobe just anterior to the falciform ligament there is a geographic region of low signal intensity  on T2 weighted imaging measuring 2.5 x 1.7 cm (image 14/series 17. This lesion is hypoenhancing on postcontrast T1 weighted imaging (series 28 29. Lesion is slightly hyperintense on precontrast T1 weighted imaging. Lesion identified lens in character cysts adjacent the gallbladder fossa (image 18/series 16) measuring 1 cm) Pancreas: Normal pancreatic parenchymal intensity. No ductal dilatation or inflammation. Spleen: Normal spleen. Adrenals/urinary tract: Adrenal glands and kidneys are normal. Stomach/Bowel: Stomach and limited of the small bowel is unremarkable Vascular/Lymphatic: Abdominal aortic normal caliber. No retroperitoneal periportal lymphadenopathy. Musculoskeletal: No aggressive osseous lesion IMPRESSION: 1. No choledocholithiasis. No biliary obstruction. Normal biliary tree. 2. Normal gallbladder. 3. Two benign appearing hepatic lesions in typical locations of benign focal fatty infiltration. Electronically Signed   By: Genevive Bi M.D.   On: 05/28/2020 11:55   MR ABDOMEN MRCP W WO CONTAST  Result Date: 05/28/2020 CLINICAL DATA:  Jaundice, abdominal pain. Evaluate for choledochal lithiasis EXAM: MRI ABDOMEN WITHOUT AND WITH CONTRAST (INCLUDING MRCP) TECHNIQUE: Multiplanar multisequence MR imaging of the abdomen was performed both before and after the administration of intravenous contrast. Heavily T2-weighted images of the biliary and pancreatic ducts were obtained, and three-dimensional MRCP images were rendered by post processing. CONTRAST:  46mL GADAVIST GADOBUTROL 1 MMOL/ML IV SOLN COMPARISON:  None. FINDINGS: Lower chest:  Lung bases are clear. Hepatobiliary: no intrahepatic biliary duct dilatation. No extrahepatic biliary duct dilatation. The gallbladder common bile duct is thin at 0.3 mm. No filling defects within common bile duct. No gallstones evident within the gall bladder Within the anterior aspect of the LEFT hepatic lobe just anterior to the falciform ligament there is a  geographic region of low signal intensity on T2 weighted imaging measuring 2.5 x 1.7 cm (image 14/series 17. This lesion is hypoenhancing on postcontrast T1 weighted imaging (series 28 29. Lesion is slightly hyperintense on precontrast T1 weighted imaging. Lesion identified lens in character cysts adjacent the gallbladder fossa (image 18/series 16) measuring 1 cm) Pancreas: Normal pancreatic parenchymal intensity. No ductal dilatation or inflammation. Spleen: Normal spleen. Adrenals/urinary tract: Adrenal glands and kidneys are normal. Stomach/Bowel: Stomach and limited of the small bowel is unremarkable Vascular/Lymphatic: Abdominal aortic normal caliber. No retroperitoneal periportal lymphadenopathy. Musculoskeletal: No aggressive osseous lesion IMPRESSION: 1. No choledocholithiasis. No biliary obstruction. Normal biliary tree. 2. Normal gallbladder. 3. Two benign appearing hepatic lesions in typical locations of benign focal fatty infiltration. Electronically Signed   By: Genevive Bi M.D.   On: 05/28/2020 11:55   US Abdomen Limited RUQ (LIVER/GB)  Result Date: 05/27/2020 CLINICAL DATA:  Generalized abdominal pain, nausea and vomiting for 4 days. EXAM: ULTRASOUND ABDOMEN LIMITED RIGHT UPPER QUADRANT COMPARISON:  None. FINDINGS: Gallbladder: Gallbladder is contracted. Gallbladder wall thickness measures 5 mm, likely accentuated by the contraction. No discrete gallstones. No pericholecystic fluid. No sonographic Murphy sign elicited during the exam. Common bile duct: Diameter: 2 mm Liver: Liver is echogenic indicating fatty infiltration. No focal mass or lesion is seen within the liver. Portal vein is patent on color Doppler imaging with normal direction of blood flow towards the liver. Other: None. IMPRESSION: 1. Gallbladder is contracted, limiting characterization. No secondary signs of  acute cholecystitis. No gallstones identified. 2. No bile duct dilatation appreciated. 3. Fatty infiltration of the  liver. 4. Given the history of jaundice, consider more definitive characterization with MRCP. Electronically Signed   By: Bary Richard M.D.   On: 05/27/2020 14:31        Scheduled Meds: . sodium chloride flush  3 mL Intravenous Q12H   Continuous Infusions: . sodium chloride       LOS: 2 days     Jacquelin Hawking, MD [facility] 05/29/2020, 12:08 PM  If 7PM-7AM, please contact night-coverage [email or web address]

## 2020-05-30 DIAGNOSIS — K7200 Acute and subacute hepatic failure without coma: Secondary | ICD-10-CM | POA: Diagnosis not present

## 2020-05-30 LAB — COMPREHENSIVE METABOLIC PANEL
ALT: 986 U/L — ABNORMAL HIGH (ref 0–44)
AST: 135 U/L — ABNORMAL HIGH (ref 15–41)
Albumin: 3.3 g/dL — ABNORMAL LOW (ref 3.5–5.0)
Alkaline Phosphatase: 96 U/L (ref 38–126)
Anion gap: 6 (ref 5–15)
BUN: 12 mg/dL (ref 6–20)
CO2: 26 mmol/L (ref 22–32)
Calcium: 8.6 mg/dL — ABNORMAL LOW (ref 8.9–10.3)
Chloride: 106 mmol/L (ref 98–111)
Creatinine, Ser: 0.94 mg/dL (ref 0.61–1.24)
GFR, Estimated: >60 mL/min (ref >60)
Glucose, Bld: 125 mg/dL — ABNORMAL HIGH (ref 70–99)
Potassium: 3.4 mmol/L — ABNORMAL LOW (ref 3.5–5.1)
Sodium: 138 mmol/L (ref 135–145)
Total Bilirubin: 5.8 mg/dL — ABNORMAL HIGH (ref 0.3–1.2)
Total Protein: 5.4 g/dL — ABNORMAL LOW (ref 6.5–8.1)

## 2020-05-30 LAB — ANTI-SMOOTH MUSCLE ANTIBODY, IGG: F-Actin IgG: 3 Units (ref 0–19)

## 2020-05-30 LAB — MITOCHONDRIAL ANTIBODIES: Mitochondrial M2 Ab, IgG: <20 Units (ref 0.0–20.0)

## 2020-05-30 LAB — CERULOPLASMIN: Ceruloplasmin: 11.3 mg/dL — ABNORMAL LOW (ref 16.0–31.0)

## 2020-05-30 LAB — ALPHA-1-ANTITRYPSIN: A-1 Antitrypsin, Ser: 155 mg/dL (ref 95–164)

## 2020-05-30 LAB — HSV(HERPES SIMPLEX VRS) I + II AB-IGM: HSVI/II Comb IgM: <0.91 Ratio (ref 0.00–0.90)

## 2020-05-30 LAB — PROTIME-INR
INR: 1.2 (ref 0.8–1.2)
Prothrombin Time: 15.5 seconds — ABNORMAL HIGH (ref 11.4–15.2)

## 2020-05-30 LAB — ANA W/REFLEX IF POSITIVE: Anti Nuclear Antibody (ANA): NEGATIVE

## 2020-05-30 NOTE — Progress Notes (Signed)
[facility] Progress Note  James Mcgee 30 y.o. 1991-01-04  CC:  Jaundice  Subjective: Denies abdominal pain, nausea, vomiting. Is tolerating a regular diet.  ROS : Review of Systems  Cardiovascular: Negative for chest pain and palpitations.  Gastrointestinal: Negative for abdominal pain, blood in stool, constipation, diarrhea, heartburn, melena, nausea and vomiting.   Objective: Vital signs in last 24 hours: Vitals:   05/29/20 2013 05/30/20 0440  BP: 124/76 112/87  Pulse: 83 65  Resp: 20 16  Temp: 98.2 F (36.8 C) 97.7 F (36.5 C)  SpO2: 97%     Physical Exam:  General:  Alert, oriented, cooperative, no distress, jaundiced  Head:  Normocephalic, without obvious abnormality, atraumatic  Eyes:  Scleral icterus, EOMs intact  Lungs:   Clear to auscultation bilaterally, respirations unlabored  Heart:  Regular rate and rhythm, S1, S2 normal  Abdomen:   Soft, non-tender, non-distended, bowel sounds active all four quadrants  Extremities: Extremities normal, atraumatic, no  edema    Lab Results: Recent Labs    05/29/20 0437 05/30/20 0435  NA 140 138  K 3.9 3.4*  CL 104 106  CO2 27 26  GLUCOSE 101* 125*  BUN 11 12  CREATININE 0.91 0.94  CALCIUM 9.3 8.6*   Recent Labs    05/29/20 0437 05/30/20 0435  AST 293* 135*  ALT 1,594* 986*  ALKPHOS 121 96  BILITOT 7.1* 5.8*  PROT 5.8* 5.4*  ALBUMIN 3.6 3.3*   Recent Labs    05/27/20 1102 05/28/20 0436  WBC 7.5 5.3  HGB 18.6* 15.7  HCT 55.7* 47.2  MCV 95.5 98.1  PLT 265 210   Recent Labs    05/29/20 0437 05/30/20 0435  LABPROT 16.7* 15.5*  INR 1.4* 1.2      Assessment: Jaundice: unknown origin, though still suspicious for viral etiology.  Patient also had been taking Tylenol and drinks 2-3 alcoholic beverages daily.   -Acute hepatitis panel negative.   -HSV, CMV, EBV IgM negative.  EBV IgG positive, suggestive of past infection/immunity. -Ceruloplasmin low -Elevated ferritin (1714) and  iron saturation (82%) -Liver biopsy yesterday, results pending -Alpha-1 antitrypsin normal -ANA/AMA/ASMA pending -T. Bili 5.8, decreased from 7.1 yesterday -AST 135, improved from 293 yesterday -ALT 986, improved from 1594 yesterday -INR now normal, 1.2 -No leukocytosis (WBCs 5.3) as of 5/2 -MRI/MRCP negative for CBD obstruction  Plan: OK to discharge from a GI standpoint.  Check hemachromatosis DNA.  Follow pathology results as an outpatient.  Repeat ceruloplasmin in 2 weeks.  Follow up with GI/hepatology in [place] (patient states he is returning to [place] in 1 week).  [facility] will sign off. Please contact us if we can be of any further assistance during this hospital stay.   Edrick Kins PA-C 05/30/2020, 9:07 AM  Contact #  [phone]

## 2020-05-30 NOTE — Progress Notes (Signed)
Provided and discussed discharge instructions. Addressed all questions and concerns. IV removed and intact. James Mcgee N James Mcgee  

## 2020-05-30 NOTE — Discharge Summary (Signed)
Physician Discharge Summary  [name] JEH:[id number] DOB: 07-10-1990 DOA: 05/27/2020  PCP: Pcp, No  Admit date: 05/27/2020 Discharge date: 05/30/2020  Admitted From: Home Disposition: Home  Recommendations for Outpatient Follow-up:  1. Follow up with PCP in 1-2 weeks 2. Please obtain BMP/CBC in one week 3. Please follow up with GI in regards to liver biopsy as scheduled  Home Health: None Equipment/Devices: None  Discharge Condition: Stable CODE STATUS: Full Diet recommendation: Low-fat diet as directed  Brief/Interim Summary: James Mcgee is a 30 y.o. male with a history of hypertension. He presented secondary to persistent nausea/vomiting and found to have evidence of acute liver failure. Workup started. GI consulted.  Patient's work-up was essentially unremarkable, minimally elevated iron and low ceruloplasmin but other viral etiologies and imaging was unremarkable for any overt findings to explain patient's markedly elevated AST ALT and bilirubin.  Patient's labs are correcting with supportive care alone.  This makes some acute incident/toxic intake/medication interaction much more likely, but unfortunately no clear indication was ever discovered prior to discharge.  Patient otherwise denies any further abdominal pain nausea vomiting diarrhea constipation headache fevers chills chest pain or shortness of breath -and currently requesting discharge home which is certainly reasonable given downtrending labs and no other issues or indications concerning for ongoing etiology.  Patient did have liver biopsy taken, he is to follow-up with local GI in the next 1 to 2 weeks.  His PCP and current residence is in [place] which we discussed he should follow-up at [facility] clinic where his PCP practices for further work-up and evaluation pending liver biopsy and further findings per the GI team in the outpatient setting.Marland Kitchen   Discharge Diagnoses:  Principal Problem:   Acute liver failure Active  Problems:   Primary hypertension    Discharge Instructions  Discharge Instructions    Call MD for:  persistant nausea and vomiting   Complete by: As directed    Diet - low sodium heart healthy   Complete by: As directed    Increase activity slowly   Complete by: As directed    No wound care   Complete by: As directed      Allergies as of 05/30/2020   No Known Allergies     Medication List    You have not been prescribed any medications.     No Known Allergies  Consultations:  GI   Procedures/Studies: DG Chest 2 View  Result Date: 05/27/2020 CLINICAL DATA:  Chest tightness EXAM: CHEST - 2 VIEW COMPARISON:  None. FINDINGS: The heart size and mediastinal contours are within normal limits. Both lungs are clear. The visualized skeletal structures are unremarkable. IMPRESSION: No active cardiopulmonary disease. Electronically Signed   By: Gerome Sam III M.D   On: 05/27/2020 11:59   MR 3D Recon At Scanner  Result Date: 05/28/2020 CLINICAL DATA:  Jaundice, abdominal pain. Evaluate for choledochal lithiasis EXAM: MRI ABDOMEN WITHOUT AND WITH CONTRAST (INCLUDING MRCP) TECHNIQUE: Multiplanar multisequence MR imaging of the abdomen was performed both before and after the administration of intravenous contrast. Heavily T2-weighted images of the biliary and pancreatic ducts were obtained, and three-dimensional MRCP images were rendered by post processing. CONTRAST:  49mL GADAVIST GADOBUTROL 1 MMOL/ML IV SOLN COMPARISON:  None. FINDINGS: Lower chest:  Lung bases are clear. Hepatobiliary: no intrahepatic biliary duct dilatation. No extrahepatic biliary duct dilatation. The gallbladder common bile duct is thin at 0.3 mm. No filling defects within common bile duct. No gallstones evident within the gall bladder Within the anterior  aspect of the LEFT hepatic lobe just anterior to the falciform ligament there is a geographic region of low signal intensity on T2 weighted imaging measuring 2.5 x  1.7 cm (image 14/series 17. This lesion is hypoenhancing on postcontrast T1 weighted imaging (series 28 29. Lesion is slightly hyperintense on precontrast T1 weighted imaging. Lesion identified lens in character cysts adjacent the gallbladder fossa (image 18/series 16) measuring 1 cm) Pancreas: Normal pancreatic parenchymal intensity. No ductal dilatation or inflammation. Spleen: Normal spleen. Adrenals/urinary tract: Adrenal glands and kidneys are normal. Stomach/Bowel: Stomach and limited of the small bowel is unremarkable Vascular/Lymphatic: Abdominal aortic normal caliber. No retroperitoneal periportal lymphadenopathy. Musculoskeletal: No aggressive osseous lesion IMPRESSION: 1. No choledocholithiasis. No biliary obstruction. Normal biliary tree. 2. Normal gallbladder. 3. Two benign appearing hepatic lesions in typical locations of benign focal fatty infiltration. Electronically Signed   By: Genevive Bi M.D.   On: 05/28/2020 11:55   US BIOPSY (LIVER)  Result Date: 05/29/2020 INDICATION: Elevated LFTs of uncertain etiology. Please ultrasound-guided liver biopsy for tissue diagnostic purposes. EXAM: ULTRASOUND GUIDED LIVER BIOPSY COMPARISON:  None. MEDICATIONS: None ANESTHESIA/SEDATION: Fentanyl 100 mcg IV; Versed 4 mg IV Total Moderate Sedation time: 11 minutes; The patient was continuously monitored during the procedure by the interventional radiology nurse under my direct supervision. COMPLICATIONS: None immediate. PROCEDURE: Informed written consent was obtained from the patient after a discussion of the risks, benefits and alternatives to treatment. The patient understands and consents the procedure. A timeout was performed prior to the initiation of the procedure. Ultrasound scanning was performed of the right upper abdominal quadrant and the procedure was planned. The right upper abdomen was prepped and draped in the usual sterile fashion. The overlying soft tissues were anesthetized with 1%  lidocaine with epinephrine. A 17 gauge, 6.8 cm co-axial needle was advanced into a peripheral aspect of the right lobe of the liver and 3 core biopsies were obtained with an 18 gauge core device under direct ultrasound guidance. The co-axial needle track was embolized with the administration of a Gel-Foam slurry. Superficial hemostasis was obtained with manual compression. Post procedural scanning was negative for definitive area of hemorrhage. A dressing was placed. The patient tolerated the procedure well without immediate post procedural complication. IMPRESSION: Technically successful ultrasound guided liver biopsy. Electronically Signed   By: Simonne Come M.D.   On: 05/29/2020 15:28   MR ABDOMEN MRCP W WO CONTAST  Result Date: 05/28/2020 CLINICAL DATA:  Jaundice, abdominal pain. Evaluate for choledochal lithiasis EXAM: MRI ABDOMEN WITHOUT AND WITH CONTRAST (INCLUDING MRCP) TECHNIQUE: Multiplanar multisequence MR imaging of the abdomen was performed both before and after the administration of intravenous contrast. Heavily T2-weighted images of the biliary and pancreatic ducts were obtained, and three-dimensional MRCP images were rendered by post processing. CONTRAST:  10mL GADAVIST GADOBUTROL 1 MMOL/ML IV SOLN COMPARISON:  None. FINDINGS: Lower chest:  Lung bases are clear. Hepatobiliary: no intrahepatic biliary duct dilatation. No extrahepatic biliary duct dilatation. The gallbladder common bile duct is thin at 0.3 mm. No filling defects within common bile duct. No gallstones evident within the gall bladder Within the anterior aspect of the LEFT hepatic lobe just anterior to the falciform ligament there is a geographic region of low signal intensity on T2 weighted imaging measuring 2.5 x 1.7 cm (image 14/series 17. This lesion is hypoenhancing on postcontrast T1 weighted imaging (series 28 29. Lesion is slightly hyperintense on precontrast T1 weighted imaging. Lesion identified lens in character cysts  adjacent the gallbladder fossa (image 18/series  16) measuring 1 cm) Pancreas: Normal pancreatic parenchymal intensity. No ductal dilatation or inflammation. Spleen: Normal spleen. Adrenals/urinary tract: Adrenal glands and kidneys are normal. Stomach/Bowel: Stomach and limited of the small bowel is unremarkable Vascular/Lymphatic: Abdominal aortic normal caliber. No retroperitoneal periportal lymphadenopathy. Musculoskeletal: No aggressive osseous lesion IMPRESSION: 1. No choledocholithiasis. No biliary obstruction. Normal biliary tree. 2. Normal gallbladder. 3. Two benign appearing hepatic lesions in typical locations of benign focal fatty infiltration. Electronically Signed   By: Genevive Bi M.D.   On: 05/28/2020 11:55   US Abdomen Limited RUQ (LIVER/GB)  Result Date: 05/27/2020 CLINICAL DATA:  Generalized abdominal pain, nausea and vomiting for 4 days. EXAM: ULTRASOUND ABDOMEN LIMITED RIGHT UPPER QUADRANT COMPARISON:  None. FINDINGS: Gallbladder: Gallbladder is contracted. Gallbladder wall thickness measures 5 mm, likely accentuated by the contraction. No discrete gallstones. No pericholecystic fluid. No sonographic Murphy sign elicited during the exam. Common bile duct: Diameter: 2 mm Liver: Liver is echogenic indicating fatty infiltration. No focal mass or lesion is seen within the liver. Portal vein is patent on color Doppler imaging with normal direction of blood flow towards the liver. Other: None. IMPRESSION: 1. Gallbladder is contracted, limiting characterization. No secondary signs of acute cholecystitis. No gallstones identified. 2. No bile duct dilatation appreciated. 3. Fatty infiltration of the liver. 4. Given the history of jaundice, consider more definitive characterization with MRCP. Electronically Signed   By: Bary Richard M.D.   On: 05/27/2020 14:31     Subjective: No acute issues or events overnight   Discharge Exam: Vitals:   05/29/20 2013 05/30/20 0440  BP: 124/76 112/87   Pulse: 83 65  Resp: 20 16  Temp: 98.2 F (36.8 C) 97.7 F (36.5 C)  SpO2: 97%    Vitals:   05/29/20 1514 05/29/20 1514 05/29/20 2013 05/30/20 0440  BP: 135/78  124/76 112/87  Pulse: 86 93 83 65  Resp: 16  20 16   Temp: 98.5 F (36.9 C)  98.2 F (36.8 C) 97.7 F (36.5 C)  TempSrc: Oral  Oral Oral  SpO2: 96% 96% 97%   Weight:      Height:        General: Pt is alert, awake, not in acute distress Cardiovascular: RRR, S1/S2 +, no rubs, no gallops Respiratory: CTA bilaterally, no wheezing, no rhonchi Abdominal: Soft, NT, ND, bowel sounds + Extremities: no edema, no cyanosis    The results of significant diagnostics from this hospitalization (including imaging, microbiology, ancillary and laboratory) are listed below for reference.     Microbiology: Recent Results (from the past 240 hour(s))  Resp Panel by RT-PCR (Flu A&B, Covid) Nasopharyngeal Swab     Status: None   Collection Time: 05/27/20 12:19 PM   Specimen: Nasopharyngeal Swab; Nasopharyngeal(NP) swabs in vial transport medium  Result Value Ref Range Status   SARS Coronavirus 2 by RT PCR NEGATIVE NEGATIVE Final    Comment: (NOTE) SARS-CoV-2 target nucleic acids are NOT DETECTED.  The SARS-CoV-2 RNA is generally detectable in upper respiratory specimens during the acute phase of infection. The lowest concentration of SARS-CoV-2 viral copies this assay can detect is 138 copies/mL. A negative result does not preclude SARS-Cov-2 infection and should not be used as the sole basis for treatment or other patient management decisions. A negative result may occur with  improper specimen collection/handling, submission of specimen other than nasopharyngeal swab, presence of viral mutation(s) within the areas targeted by this assay, and inadequate number of viral copies(<138 copies/mL). A negative result must be combined  with clinical observations, patient history, and epidemiological information. The expected result is  Negative.  Fact Sheet for Patients:  BloggerCourse.comhttps://www.fda.gov/media/152166/download  Fact Sheet for Healthcare Providers:  SeriousBroker.ithttps://www.fda.gov/media/152162/download  This test is no t yet approved or cleared by the [place] FDA and  has been authorized for detection and/or diagnosis of SARS-CoV-2 by FDA under an Emergency Use Authorization (EUA). This EUA will remain  in effect (meaning this test can be used) for the duration of the COVID-19 declaration under Section 564(b)(1) of the Act, 21 U.S.C.section 360bbb-3(b)(1), unless the authorization is terminated  or revoked sooner.       Influenza A by PCR NEGATIVE NEGATIVE Final   Influenza B by PCR NEGATIVE NEGATIVE Final    Comment: (NOTE) The Xpert Xpress SARS-CoV-2/FLU/RSV plus assay is intended as an aid in the diagnosis of influenza from Nasopharyngeal swab specimens and should not be used as a sole basis for treatment. Nasal washings and aspirates are unacceptable for Xpert Xpress SARS-CoV-2/FLU/RSV testing.  Fact Sheet for Patients: BloggerCourse.comhttps://www.fda.gov/media/152166/download  Fact Sheet for Healthcare Providers: SeriousBroker.ithttps://www.fda.gov/media/152162/download  This test is not yet approved or cleared by the [place] FDA and has been authorized for detection and/or diagnosis of SARS-CoV-2 by FDA under an Emergency Use Authorization (EUA). This EUA will remain in effect (meaning this test can be used) for the duration of the COVID-19 declaration under Section 564(b)(1) of the Act, 21 U.S.C. section 360bbb-3(b)(1), unless the authorization is terminated or revoked.  Performed at [facility], [address] [place], [place] [address]      Labs: BNP (last 3 results) No results for input(s): BNP in the last 8760 hours. Basic Metabolic Panel: Recent Labs  Lab 05/27/20 1102 05/28/20 0436 05/29/20 0437 05/30/20 0435  NA 135 140 140 138  K 3.9 3.5 3.9 3.4*  CL 99 106 104 106  CO2 21* 26 27 26    GLUCOSE 86 106* 101* 125*  BUN 14 10 11 12   CREATININE 0.69 0.75 0.91 0.94  CALCIUM 9.9 8.9 9.3 8.6*   Liver Function Tests: Recent Labs  Lab 05/27/20 1102 05/28/20 0436 05/29/20 0437 05/30/20 0435  AST 1,929* 686* 293* 135*  ALT 5,698* 2,163* 1,594* 986*  ALKPHOS 171* 125 121 96  BILITOT 10.3* 7.2* 7.1* 5.8*  PROT 7.0 5.4* 5.8* 5.4*  ALBUMIN 4.4 3.5 3.6 3.3*   Recent Labs  Lab 05/27/20 1247  LIPASE 62*   No results for input(s): AMMONIA in the last 168 hours. CBC: Recent Labs  Lab 05/27/20 1102 05/28/20 0436  WBC 7.5 5.3  HGB 18.6* 15.7  HCT 55.7* 47.2  MCV 95.5 98.1  PLT 265 210   Cardiac Enzymes: No results for input(s): CKTOTAL, CKMB, CKMBINDEX, TROPONINI in the last 168 hours. BNP: Invalid input(s): POCBNP CBG: No results for input(s): GLUCAP in the last 168 hours. D-Dimer No results for input(s): DDIMER in the last 72 hours. Hgb A1c No results for input(s): HGBA1C in the last 72 hours. Lipid Profile No results for input(s): CHOL, HDL, LDLCALC, TRIG, CHOLHDL, LDLDIRECT in the last 72 hours. Thyroid function studies No results for input(s): TSH, T4TOTAL, T3FREE, THYROIDAB in the last 72 hours.  Invalid input(s): FREET3 Anemia work up Recent Labs    05/29/20 1114  FERRITIN 1,714*  TIBC 326  IRON 267*   Urinalysis    Component Value Date/Time   COLORURINE AMBER (A) 05/27/2020 1530   APPEARANCEUR CLEAR 05/27/2020 1530   LABSPEC 1.020 05/27/2020 1530   PHURINE 5.5 05/27/2020 1530  GLUCOSEU NEGATIVE 05/27/2020 1530   HGBUR NEGATIVE 05/27/2020 1530   BILIRUBINUR LARGE (A) 05/27/2020 1530   KETONESUR NEGATIVE 05/27/2020 1530   PROTEINUR NEGATIVE 05/27/2020 1530   NITRITE NEGATIVE 05/27/2020 1530   LEUKOCYTESUR NEGATIVE 05/27/2020 1530   Sepsis Labs Invalid input(s): PROCALCITONIN,  WBC,  LACTICIDVEN Microbiology Recent Results (from the past 240 hour(s))  Resp Panel by RT-PCR (Flu A&B, Covid) Nasopharyngeal Swab     Status: None    Collection Time: 05/27/20 12:19 PM   Specimen: Nasopharyngeal Swab; Nasopharyngeal(NP) swabs in vial transport medium  Result Value Ref Range Status   SARS Coronavirus 2 by RT PCR NEGATIVE NEGATIVE Final    Comment: (NOTE) SARS-CoV-2 target nucleic acids are NOT DETECTED.  The SARS-CoV-2 RNA is generally detectable in upper respiratory specimens during the acute phase of infection. The lowest concentration of SARS-CoV-2 viral copies this assay can detect is 138 copies/mL. A negative result does not preclude SARS-Cov-2 infection and should not be used as the sole basis for treatment or other patient management decisions. A negative result may occur with  improper specimen collection/handling, submission of specimen other than nasopharyngeal swab, presence of viral mutation(s) within the areas targeted by this assay, and inadequate number of viral copies(<138 copies/mL). A negative result must be combined with clinical observations, patient history, and epidemiological information. The expected result is Negative.  Fact Sheet for Patients:  BloggerCourse.com  Fact Sheet for Healthcare Providers:  SeriousBroker.it  This test is no t yet approved or cleared by the [place] FDA and  has been authorized for detection and/or diagnosis of SARS-CoV-2 by FDA under an Emergency Use Authorization (EUA). This EUA will remain  in effect (meaning this test can be used) for the duration of the COVID-19 declaration under Section 564(b)(1) of the Act, 21 U.S.C.section 360bbb-3(b)(1), unless the authorization is terminated  or revoked sooner.       Influenza A by PCR NEGATIVE NEGATIVE Final   Influenza B by PCR NEGATIVE NEGATIVE Final    Comment: (NOTE) The Xpert Xpress SARS-CoV-2/FLU/RSV plus assay is intended as an aid in the diagnosis of influenza from Nasopharyngeal swab specimens and should not be used as a sole basis for treatment.  Nasal washings and aspirates are unacceptable for Xpert Xpress SARS-CoV-2/FLU/RSV testing.  Fact Sheet for Patients: BloggerCourse.com  Fact Sheet for Healthcare Providers: SeriousBroker.it  This test is not yet approved or cleared by the [place] FDA and has been authorized for detection and/or diagnosis of SARS-CoV-2 by FDA under an Emergency Use Authorization (EUA). This EUA will remain in effect (meaning this test can be used) for the duration of the COVID-19 declaration under Section 564(b)(1) of the Act, 21 U.S.C. section 360bbb-3(b)(1), unless the authorization is terminated or revoked.  Performed at [facility], [address] [place], [place] [address]      Time coordinating discharge: Over 30 minutes  SIGNED:   Azucena Fallen, DO [facility] 05/30/2020, 6:46 PM Pager   If 7PM-7AM, please contact night-coverage [email or web address]

## 2020-06-04 LAB — HEMOCHROMATOSIS DNA-PCR(C282Y,H63D)

## 2020-06-08 ENCOUNTER — Encounter (HOSPITAL_COMMUNITY): Payer: Self-pay

## 2020-06-08 LAB — SURGICAL PATHOLOGY

## 2022-05-25 IMAGING — DX DG CHEST 2V
2 series · 2 of 2 positions shown · non-contrast
Comparison: None.

CLINICAL DATA: Chest tightness

EXAM:
CHEST - 2 VIEW

[chest pa]
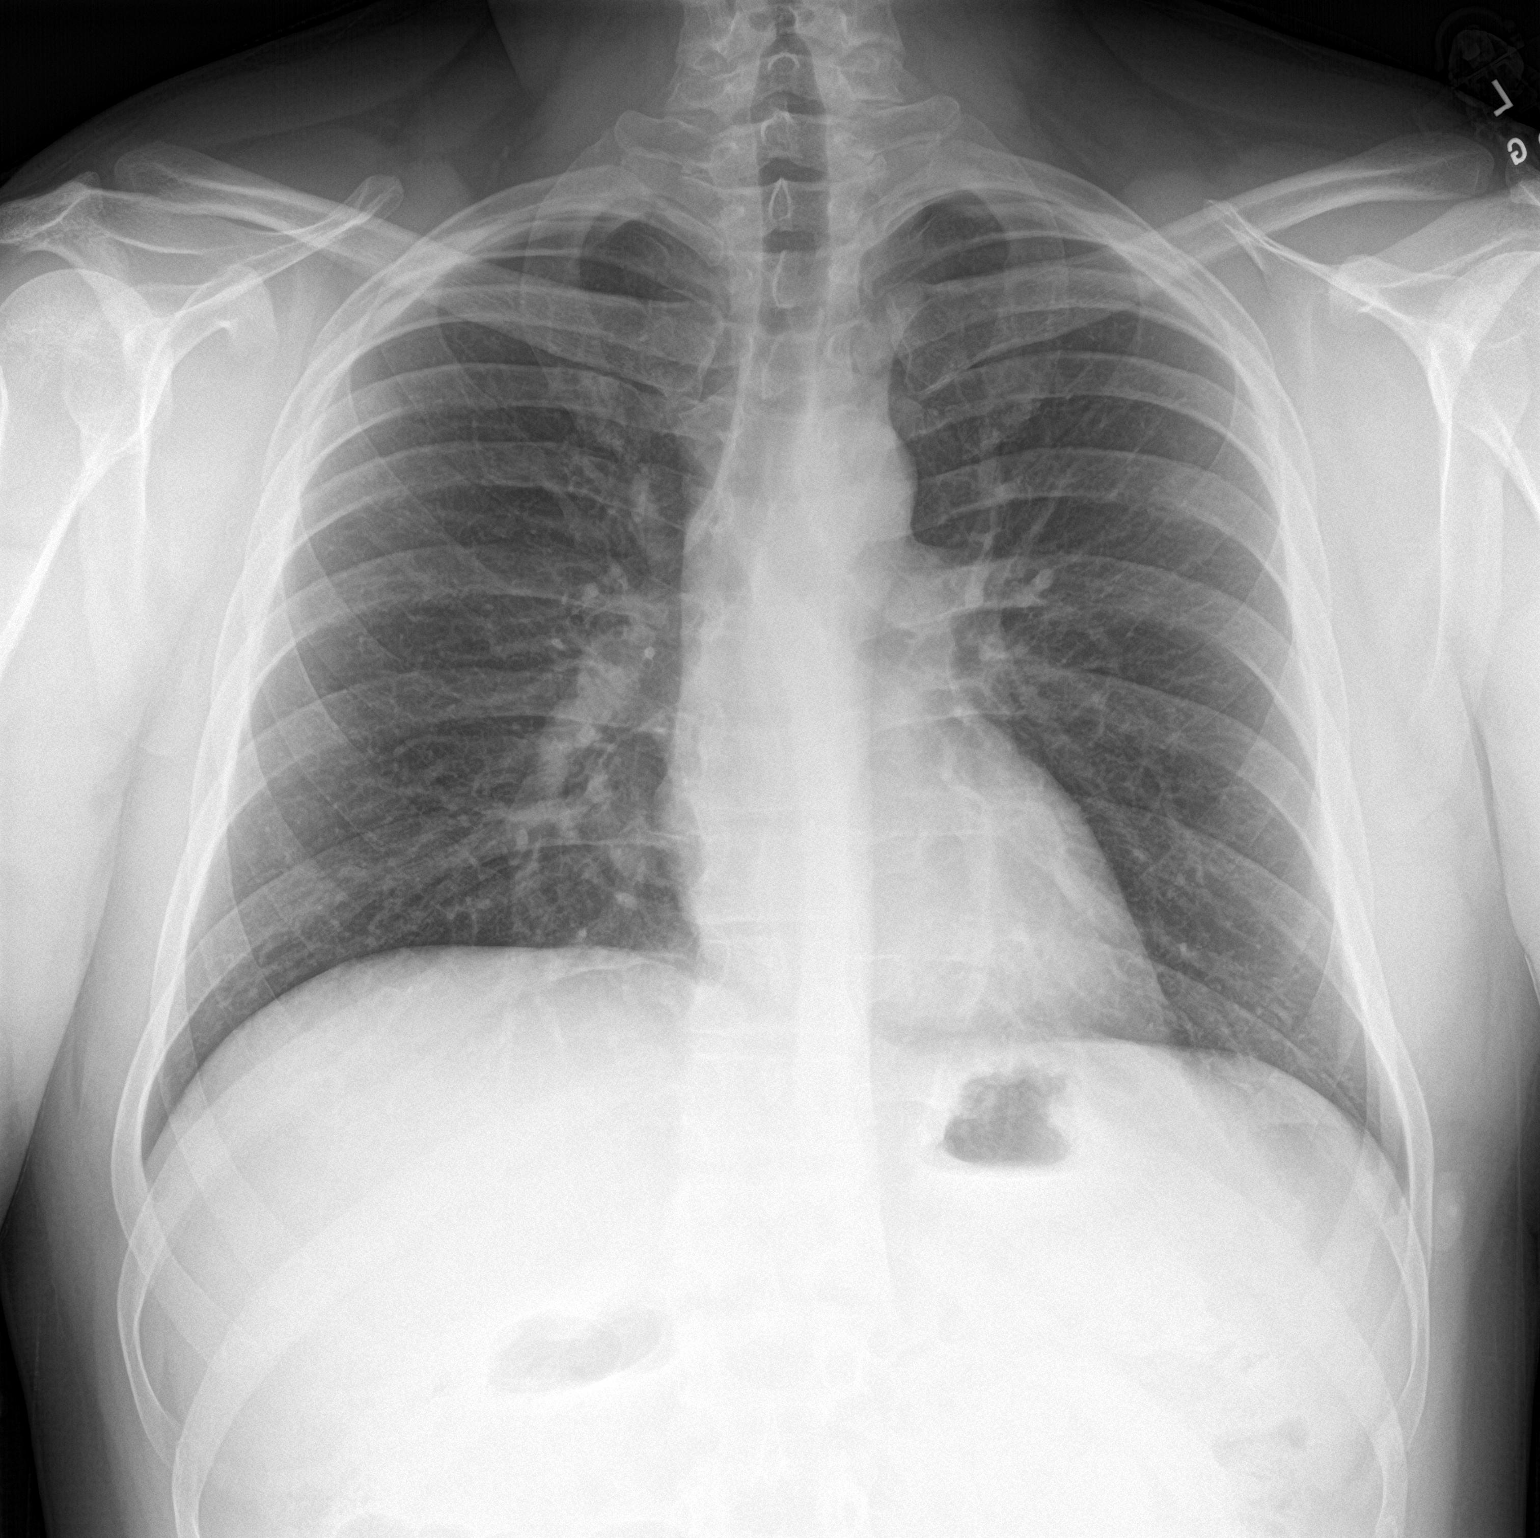

[chest lat]
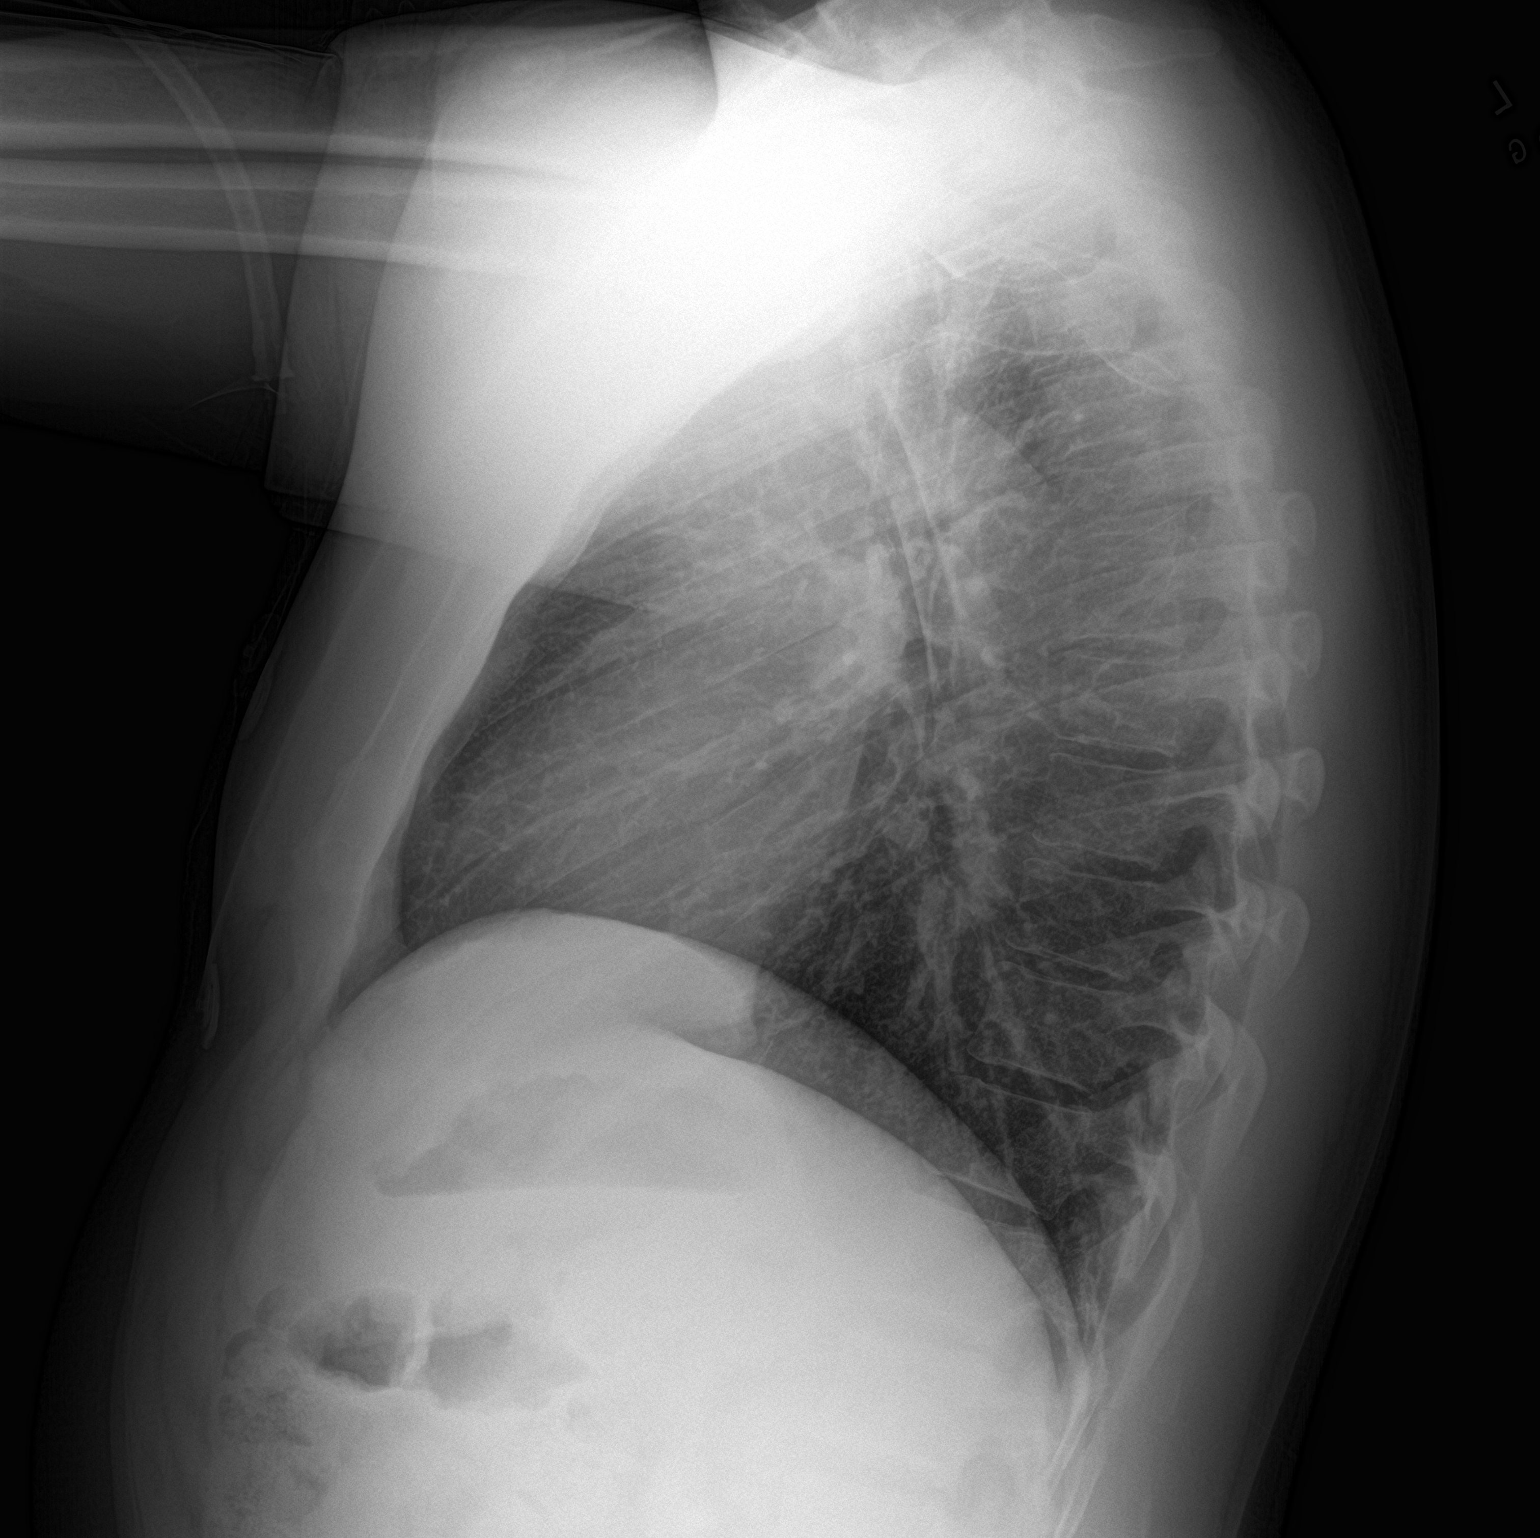

[2 of 2 positions shown; findings below may reference images not displayed]

FINDINGS: The heart size and mediastinal contours are within normal limits.
Both lungs are clear. The visualized skeletal structures are
unremarkable.
IMPRESSION: No active cardiopulmonary disease.

## 2022-05-28 IMAGING — US US BIOPSY CORE LIVER
1 series · 15 of 25 positions shown · non-contrast
Comparison: None.

INDICATION: Elevated LFTs of uncertain etiology. Please ultrasound-guided liver
biopsy for tissue diagnostic purposes.

EXAM:
ULTRASOUND GUIDED LIVER BIOPSY

[Series 1: us biopsy mc & wl · 15 of 25 slices shown]
[im 1/25]
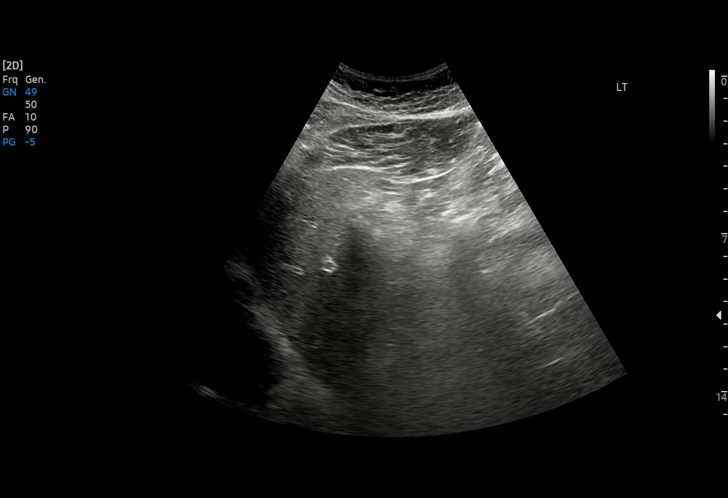
[im 3/25]
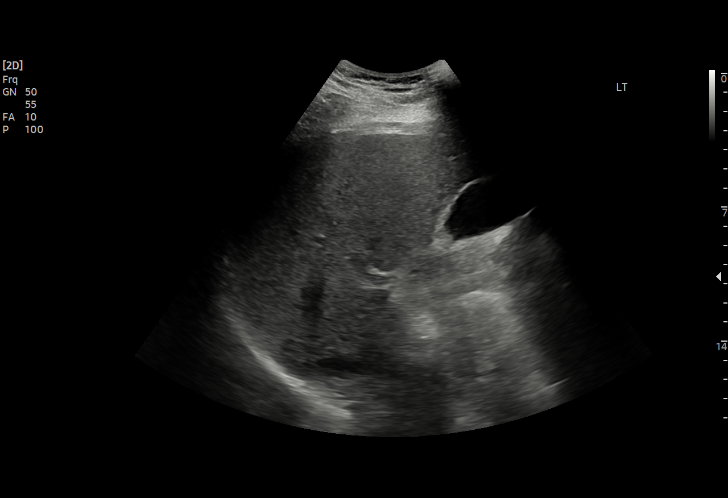
[im 5/25]
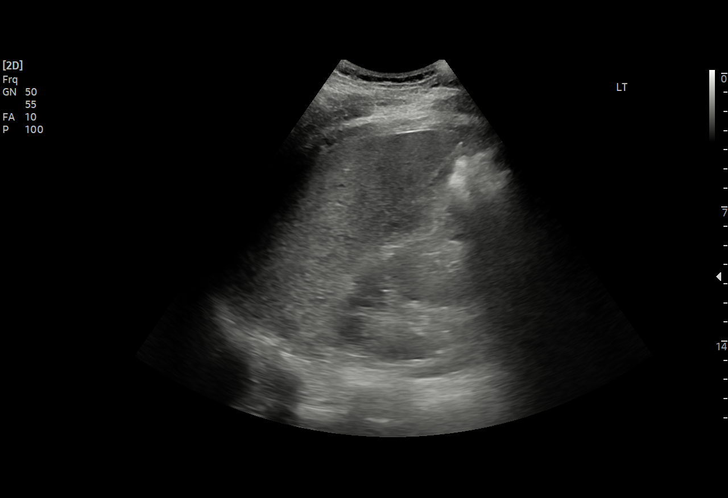
[im 6/25]
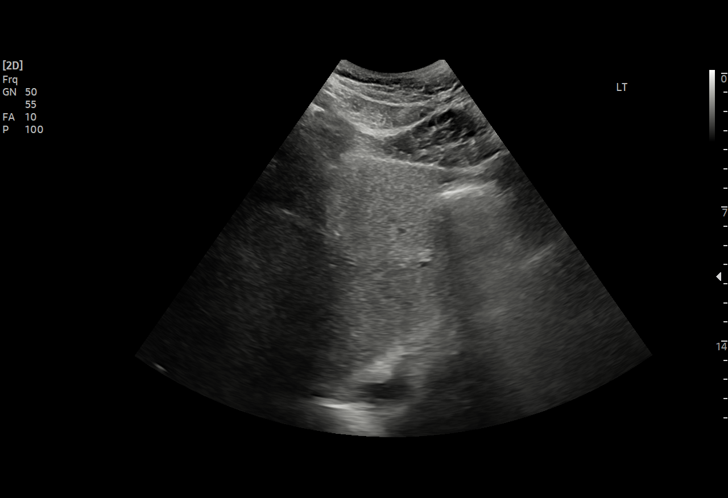
[im 8/25]
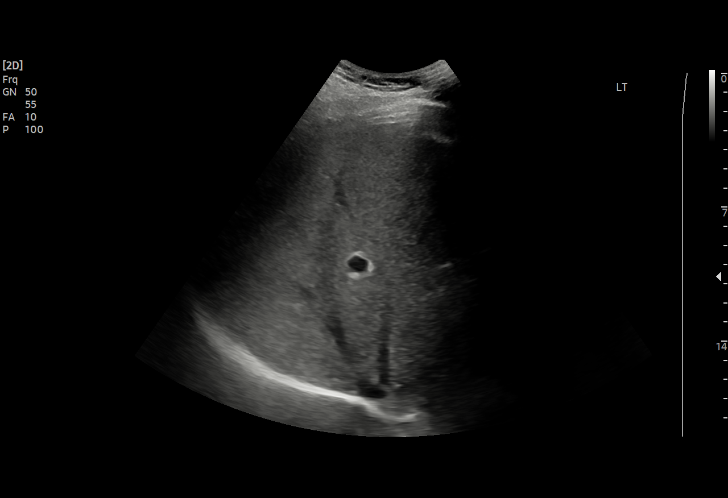
[im 10/25]
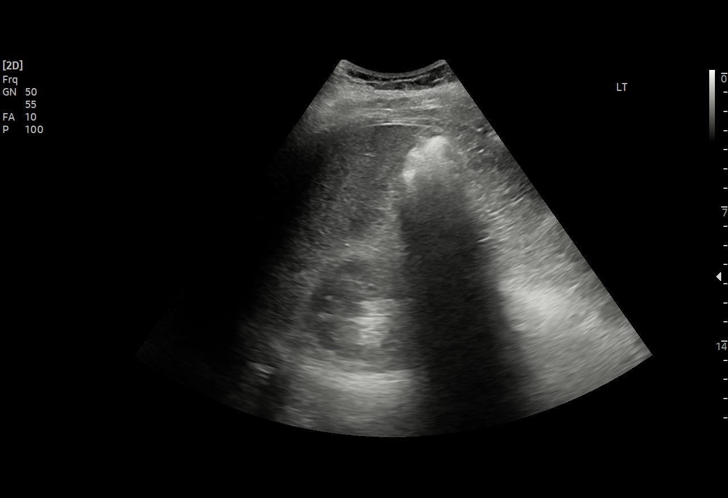
[im 11/25]
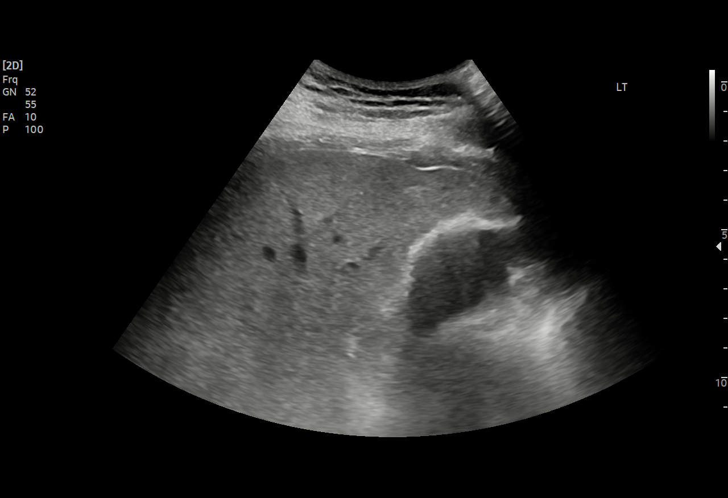
[im 13/25]
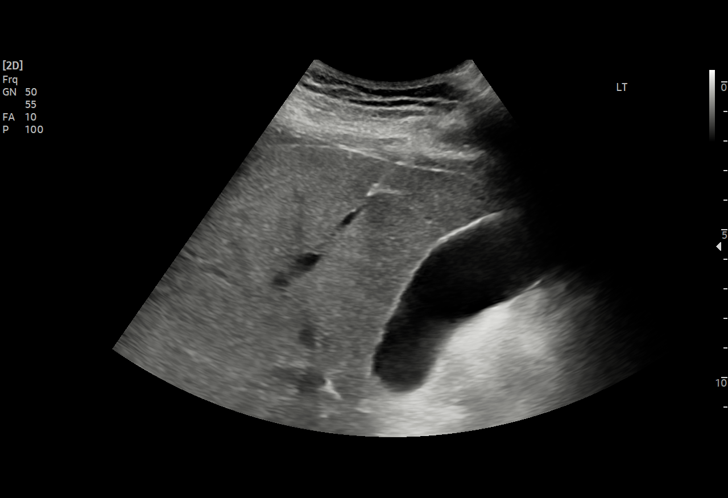
[im 15/25]
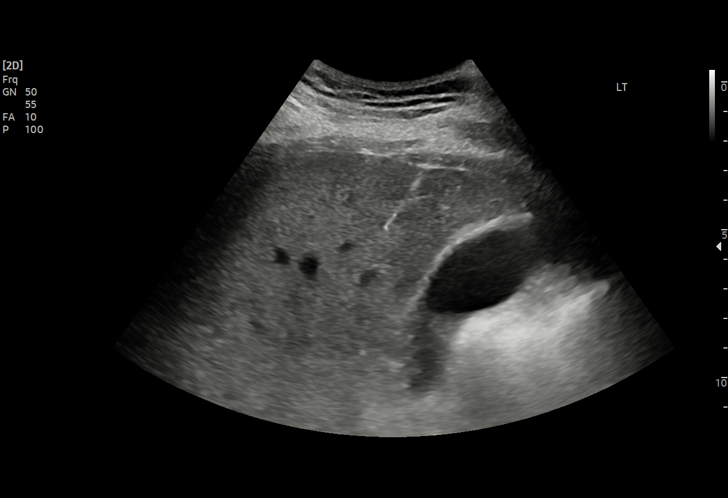
[im 16/25]
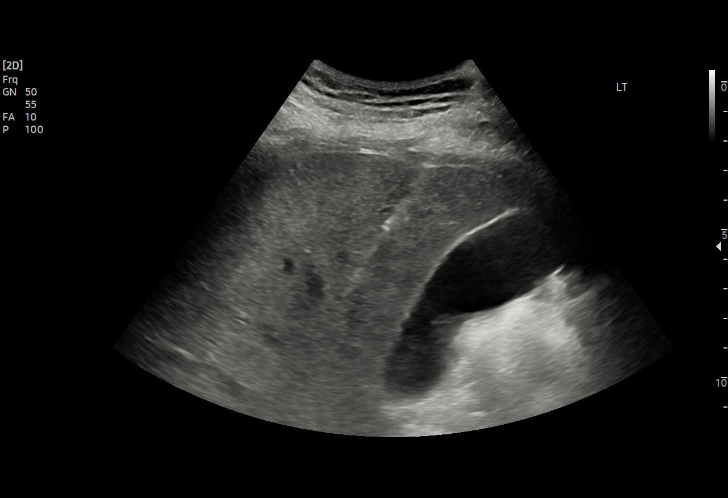
[im 18/25]
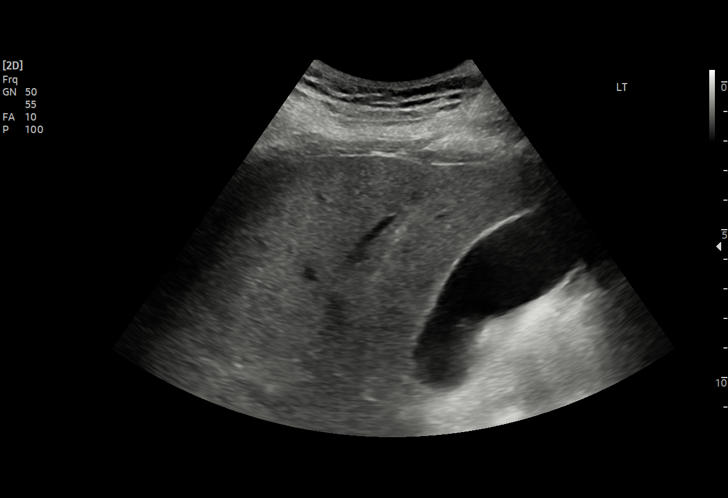
[im 20/25]
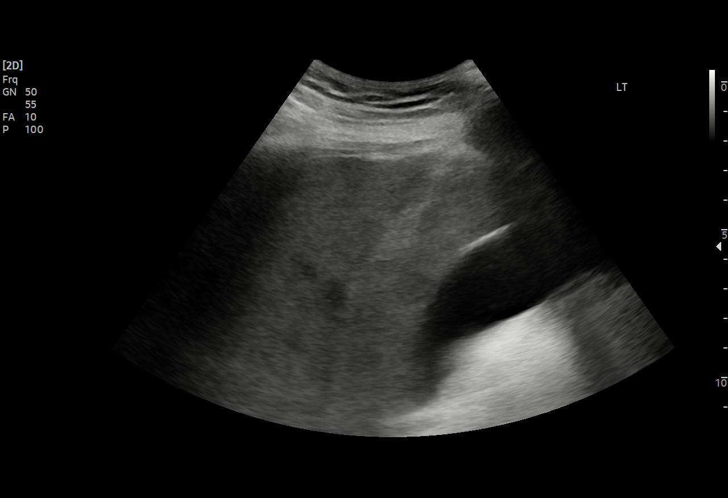
[im 21/25]
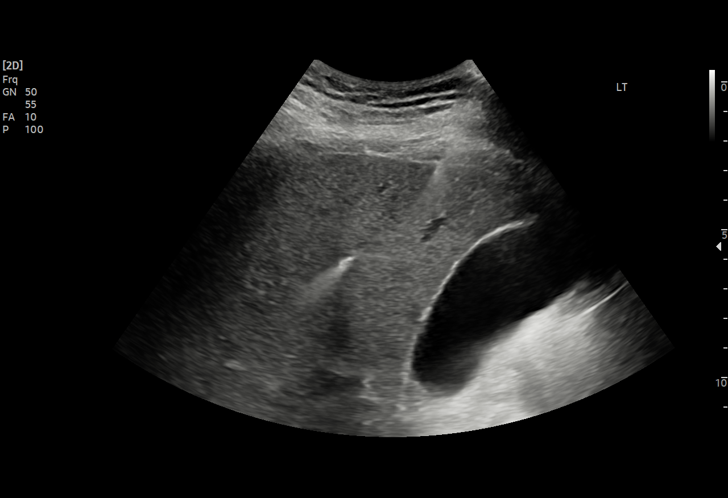
[im 23/25]
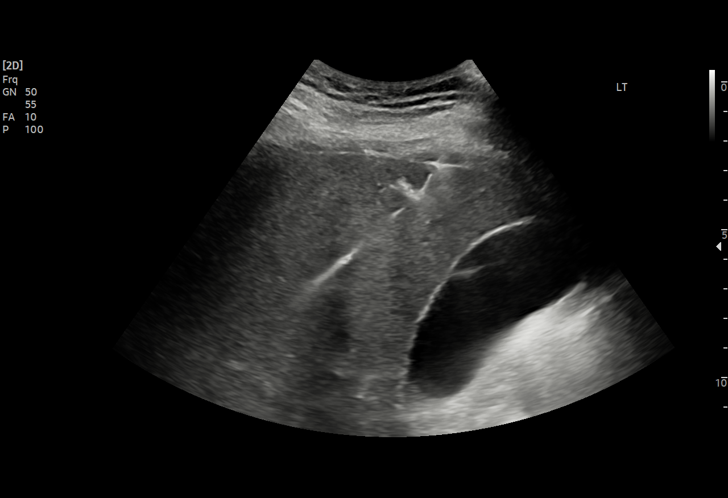
[im 25/25]
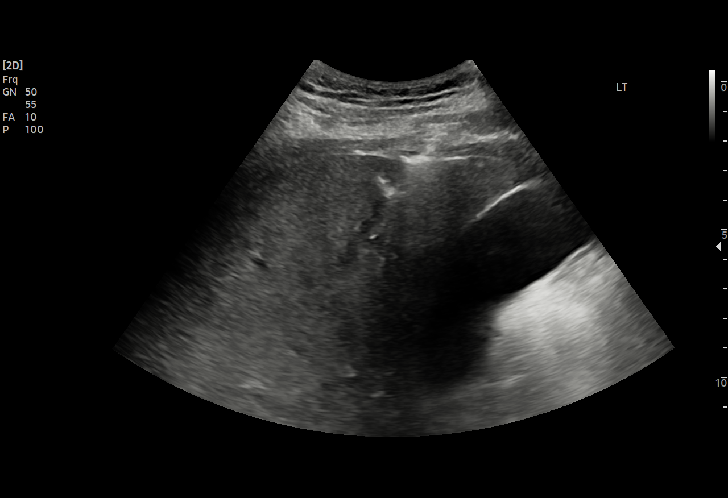

[15 of 25 positions shown; findings below may reference images not displayed]

MEDICATIONS:
None

ANESTHESIA/SEDATION:
Fentanyl 100 mcg IV; Versed 4 mg IV

Total Moderate Sedation time: 11 minutes; The patient was
continuously monitored during the procedure by the interventional
radiology nurse under my direct supervision.

COMPLICATIONS:
None immediate.

PROCEDURE:
Informed written consent was obtained from the patient after a
discussion of the risks, benefits and alternatives to treatment. The
patient understands and consents the procedure. A timeout was
performed prior to the initiation of the procedure.

Ultrasound scanning was performed of the right upper abdominal
quadrant and the procedure was planned. The right upper abdomen was
prepped and draped in the usual sterile fashion. The overlying soft
tissues were anesthetized with 1% lidocaine with epinephrine. A 17
gauge, 6.8 cm co-axial needle was advanced into a peripheral aspect
of the right lobe of the liver and 3 core biopsies were obtained
with an 18 gauge core device under direct ultrasound guidance.

The co-axial needle track was embolized with the administration of a
Gel-Foam slurry. Superficial hemostasis was obtained with manual
compression. Post procedural scanning was negative for definitive
area of hemorrhage. A dressing was placed. The patient tolerated the
procedure well without immediate post procedural complication.
IMPRESSION: Technically successful ultrasound guided liver biopsy.
